# Patient Record
Sex: Male | Born: 1960 | Race: White | Hispanic: No | Smoking: Current every day smoker
Health system: Southern US, Community
[De-identification: ages and names within clinical notes are randomized; demographics above are authoritative.]

## PROBLEM LIST (undated history)

## (undated) DIAGNOSIS — I219 Acute myocardial infarction, unspecified: Secondary | ICD-10-CM

## (undated) DIAGNOSIS — K922 Gastrointestinal hemorrhage, unspecified: Secondary | ICD-10-CM

## (undated) DIAGNOSIS — M272 Inflammatory conditions of jaws: Secondary | ICD-10-CM

## (undated) DIAGNOSIS — I2541 Coronary artery aneurysm: Secondary | ICD-10-CM

## (undated) DIAGNOSIS — I2510 Atherosclerotic heart disease of native coronary artery without angina pectoris: Secondary | ICD-10-CM

## (undated) DIAGNOSIS — I2699 Other pulmonary embolism without acute cor pulmonale: Secondary | ICD-10-CM

## (undated) DIAGNOSIS — I82409 Acute embolism and thrombosis of unspecified deep veins of unspecified lower extremity: Secondary | ICD-10-CM

## (undated) DIAGNOSIS — R561 Post traumatic seizures: Secondary | ICD-10-CM

## (undated) DIAGNOSIS — Z951 Presence of aortocoronary bypass graft: Secondary | ICD-10-CM

## (undated) DIAGNOSIS — I10 Essential (primary) hypertension: Secondary | ICD-10-CM

## (undated) HISTORY — PX: IVC FILTER INSERTION: CATH118245

## (undated) HISTORY — PX: PERCUTANEOUS CORONARY STENT INTERVENTION (PCI-S): SHX6016

---

## 2004-10-21 DIAGNOSIS — R561 Post traumatic seizures: Secondary | ICD-10-CM

## 2004-10-21 HISTORY — DX: Post traumatic seizures: R56.1

## 2012-10-21 DIAGNOSIS — Z951 Presence of aortocoronary bypass graft: Secondary | ICD-10-CM

## 2012-10-21 HISTORY — PX: CORONARY ARTERY BYPASS GRAFT: SHX141

## 2012-10-21 HISTORY — DX: Presence of aortocoronary bypass graft: Z95.1

## 2016-10-21 DIAGNOSIS — M272 Inflammatory conditions of jaws: Secondary | ICD-10-CM

## 2016-10-21 HISTORY — DX: Inflammatory conditions of jaws: M27.2

## 2017-10-29 HISTORY — PX: ANEURYSM COILING: SHX5349

## 2017-11-04 ENCOUNTER — Other Ambulatory Visit: Payer: Self-pay

## 2017-11-04 ENCOUNTER — Emergency Department (HOSPITAL_COMMUNITY): Payer: Self-pay

## 2017-11-04 ENCOUNTER — Inpatient Hospital Stay (HOSPITAL_COMMUNITY)
Admission: EM | Admit: 2017-11-04 | Discharge: 2017-11-11 | DRG: 287 | Disposition: A | Discharge status: Home / Self Care | Payer: Self-pay | Attending: Nephrology | Admitting: Nephrology

## 2017-11-04 ENCOUNTER — Encounter (HOSPITAL_COMMUNITY): Payer: Self-pay

## 2017-11-04 DIAGNOSIS — Z91041 Radiographic dye allergy status: Secondary | ICD-10-CM

## 2017-11-04 DIAGNOSIS — D696 Thrombocytopenia, unspecified: Secondary | ICD-10-CM | POA: Diagnosis present

## 2017-11-04 DIAGNOSIS — R509 Fever, unspecified: Secondary | ICD-10-CM | POA: Diagnosis present

## 2017-11-04 DIAGNOSIS — M279 Disease of jaws, unspecified: Secondary | ICD-10-CM

## 2017-11-04 DIAGNOSIS — I252 Old myocardial infarction: Secondary | ICD-10-CM

## 2017-11-04 DIAGNOSIS — G40909 Epilepsy, unspecified, not intractable, without status epilepticus: Secondary | ICD-10-CM

## 2017-11-04 DIAGNOSIS — Z886 Allergy status to analgesic agent status: Secondary | ICD-10-CM

## 2017-11-04 DIAGNOSIS — I25729 Atherosclerosis of autologous artery coronary artery bypass graft(s) with unspecified angina pectoris: Secondary | ICD-10-CM

## 2017-11-04 DIAGNOSIS — Z86711 Personal history of pulmonary embolism: Secondary | ICD-10-CM

## 2017-11-04 DIAGNOSIS — Z79899 Other long term (current) drug therapy: Secondary | ICD-10-CM

## 2017-11-04 DIAGNOSIS — Z792 Long term (current) use of antibiotics: Secondary | ICD-10-CM

## 2017-11-04 DIAGNOSIS — M272 Inflammatory conditions of jaws: Secondary | ICD-10-CM | POA: Diagnosis present

## 2017-11-04 DIAGNOSIS — Z7901 Long term (current) use of anticoagulants: Secondary | ICD-10-CM

## 2017-11-04 DIAGNOSIS — R042 Hemoptysis: Secondary | ICD-10-CM | POA: Diagnosis not present

## 2017-11-04 DIAGNOSIS — Z9119 Patient's noncompliance with other medical treatment and regimen: Secondary | ICD-10-CM

## 2017-11-04 DIAGNOSIS — Z951 Presence of aortocoronary bypass graft: Secondary | ICD-10-CM

## 2017-11-04 DIAGNOSIS — Z7902 Long term (current) use of antithrombotics/antiplatelets: Secondary | ICD-10-CM

## 2017-11-04 DIAGNOSIS — I2510 Atherosclerotic heart disease of native coronary artery without angina pectoris: Secondary | ICD-10-CM | POA: Diagnosis present

## 2017-11-04 DIAGNOSIS — F17210 Nicotine dependence, cigarettes, uncomplicated: Secondary | ICD-10-CM | POA: Diagnosis present

## 2017-11-04 DIAGNOSIS — I723 Aneurysm of iliac artery: Secondary | ICD-10-CM | POA: Diagnosis present

## 2017-11-04 DIAGNOSIS — Z955 Presence of coronary angioplasty implant and graft: Secondary | ICD-10-CM

## 2017-11-04 DIAGNOSIS — I10 Essential (primary) hypertension: Secondary | ICD-10-CM | POA: Diagnosis present

## 2017-11-04 DIAGNOSIS — F418 Other specified anxiety disorders: Secondary | ICD-10-CM | POA: Diagnosis present

## 2017-11-04 DIAGNOSIS — R079 Chest pain, unspecified: Principal | ICD-10-CM | POA: Diagnosis present

## 2017-11-04 DIAGNOSIS — Z86718 Personal history of other venous thrombosis and embolism: Secondary | ICD-10-CM

## 2017-11-04 DIAGNOSIS — G40509 Epileptic seizures related to external causes, not intractable, without status epilepticus: Secondary | ICD-10-CM | POA: Diagnosis present

## 2017-11-04 HISTORY — DX: Atherosclerotic heart disease of native coronary artery without angina pectoris: I25.10

## 2017-11-04 HISTORY — DX: Essential (primary) hypertension: I10

## 2017-11-04 HISTORY — DX: Acute myocardial infarction, unspecified: I21.9

## 2017-11-04 HISTORY — DX: Post traumatic seizures: R56.1

## 2017-11-04 HISTORY — DX: Inflammatory conditions of jaws: M27.2

## 2017-11-04 HISTORY — DX: Other pulmonary embolism without acute cor pulmonale: I26.99

## 2017-11-04 HISTORY — DX: Gastrointestinal hemorrhage, unspecified: K92.2

## 2017-11-04 HISTORY — DX: Presence of aortocoronary bypass graft: Z95.1

## 2017-11-04 HISTORY — DX: Acute embolism and thrombosis of unspecified deep veins of unspecified lower extremity: I82.409

## 2017-11-04 HISTORY — DX: Coronary artery aneurysm: I25.41

## 2017-11-04 LAB — BASIC METABOLIC PANEL
ANION GAP: 6 (ref 5–15)
BUN: 18 mg/dL (ref 6–20)
CHLORIDE: 112 mmol/L — AB (ref 101–111)
CO2: 20 mmol/L — ABNORMAL LOW (ref 22–32)
Calcium: 8.6 mg/dL — ABNORMAL LOW (ref 8.9–10.3)
Creatinine, Ser: 0.85 mg/dL (ref 0.61–1.24)
GFR calc non Af Amer: >60 mL/min (ref >60)
Glucose, Bld: 86 mg/dL (ref 65–99)
POTASSIUM: 3.9 mmol/L (ref 3.5–5.1)
SODIUM: 138 mmol/L (ref 135–145)

## 2017-11-04 LAB — CBC
HEMATOCRIT: 31.8 % — AB (ref 39.0–52.0)
Hemoglobin: 10.1 g/dL — ABNORMAL LOW (ref 13.0–17.0)
MCH: 25.3 pg — ABNORMAL LOW (ref 26.0–34.0)
MCHC: 31.8 g/dL (ref 30.0–36.0)
MCV: 79.5 fL (ref 78.0–100.0)
Platelets: 154 10*3/uL (ref 150–400)
RBC: 4 MIL/uL — AB (ref 4.22–5.81)
RDW: 22.9 % — AB (ref 11.5–15.5)
WBC: 4.9 10*3/uL (ref 4.0–10.5)

## 2017-11-04 LAB — I-STAT TROPONIN, ED: Troponin i, poc: 0 ng/mL (ref 0.00–0.08)

## 2017-11-04 MED ORDER — NITROGLYCERIN 0.4 MG SL SUBL: 0.4000 mg | SUBLINGUAL_TABLET | SUBLINGUAL | Status: DC | PRN

## 2017-11-04 MED ORDER — METOPROLOL SUCCINATE ER 25 MG PO TB24
25.0000 mg | ORAL_TABLET | Freq: Every day | ORAL | Status: DC
  Administered 2017-11-05 – 2017-11-10 (×5): 25 mg via ORAL
  Filled 2017-11-04 (×7): qty 1

## 2017-11-04 MED ORDER — LEVETIRACETAM 500 MG PO TABS
1000.0000 mg | ORAL_TABLET | Freq: Two times a day (BID) | ORAL | Status: DC
  Administered 2017-11-05 – 2017-11-10 (×12): 1000 mg via ORAL
  Filled 2017-11-04 (×14): qty 2

## 2017-11-04 MED ORDER — ENOXAPARIN SODIUM 80 MG/0.8ML ~~LOC~~ SOLN
80.0000 mg | Freq: Two times a day (BID) | SUBCUTANEOUS | Status: AC
  Administered 2017-11-05 (×2): 80 mg via SUBCUTANEOUS
  Filled 2017-11-04 (×3): qty 0.8

## 2017-11-04 MED ORDER — SODIUM CHLORIDE 0.9 % IV SOLN
1.5000 g | Freq: Four times a day (QID) | INTRAVENOUS | Status: DC
  Administered 2017-11-05 – 2017-11-11 (×22): 1.5 g via INTRAVENOUS
  Filled 2017-11-04 (×29): qty 1.5

## 2017-11-04 MED ORDER — ONDANSETRON HCL 4 MG/2ML IJ SOLN
4.0000 mg | Freq: Four times a day (QID) | INTRAMUSCULAR | Status: DC | PRN
  Administered 2017-11-06 – 2017-11-08 (×2): 4 mg via INTRAVENOUS
  Filled 2017-11-04 (×3): qty 2

## 2017-11-04 MED ORDER — ONDANSETRON HCL 4 MG/2ML IJ SOLN
4.0000 mg | Freq: Once | INTRAMUSCULAR | Status: AC
  Administered 2017-11-04: 4 mg via INTRAVENOUS
  Filled 2017-11-04: qty 2

## 2017-11-04 MED ORDER — DIPHENHYDRAMINE HCL 50 MG/ML IJ SOLN
50.0000 mg | Freq: Four times a day (QID) | INTRAMUSCULAR | Status: DC | PRN
  Administered 2017-11-05 – 2017-11-10 (×18): 50 mg via INTRAVENOUS
  Filled 2017-11-04 (×18): qty 1

## 2017-11-04 MED ORDER — TEMAZEPAM 15 MG PO CAPS
30.0000 mg | ORAL_CAPSULE | Freq: Every evening | ORAL | Status: DC | PRN
  Administered 2017-11-06 – 2017-11-07 (×2): 30 mg via ORAL
  Filled 2017-11-04 (×2): qty 2

## 2017-11-04 MED ORDER — LAMOTRIGINE 100 MG PO TABS
100.0000 mg | ORAL_TABLET | Freq: Two times a day (BID) | ORAL | Status: DC
  Administered 2017-11-05 – 2017-11-10 (×12): 100 mg via ORAL
  Filled 2017-11-04 (×14): qty 1

## 2017-11-04 MED ORDER — MORPHINE SULFATE (PF) 4 MG/ML IV SOLN
4.0000 mg | Freq: Once | INTRAVENOUS | Status: AC
  Administered 2017-11-04: 4 mg via INTRAVENOUS
  Filled 2017-11-04: qty 1

## 2017-11-04 MED ORDER — MORPHINE SULFATE (PF) 2 MG/ML IV SOLN
2.0000 mg | INTRAVENOUS | Status: DC | PRN
  Administered 2017-11-05 (×6): 2 mg via INTRAVENOUS
  Filled 2017-11-04 (×6): qty 1

## 2017-11-04 MED ORDER — ATORVASTATIN CALCIUM 20 MG PO TABS
20.0000 mg | ORAL_TABLET | Freq: Every day | ORAL | Status: DC
  Administered 2017-11-05 – 2017-11-10 (×5): 20 mg via ORAL
  Filled 2017-11-04 (×7): qty 1

## 2017-11-04 MED ORDER — TICAGRELOR 90 MG PO TABS
90.0000 mg | ORAL_TABLET | Freq: Every day | ORAL | Status: DC
  Administered 2017-11-05: 90 mg via ORAL
  Filled 2017-11-04: qty 1

## 2017-11-04 MED ORDER — LOSARTAN POTASSIUM 50 MG PO TABS
100.0000 mg | ORAL_TABLET | Freq: Every day | ORAL | Status: DC
  Administered 2017-11-05 – 2017-11-10 (×5): 100 mg via ORAL
  Filled 2017-11-04 (×7): qty 2

## 2017-11-04 MED ORDER — SERTRALINE HCL 100 MG PO TABS
100.0000 mg | ORAL_TABLET | Freq: Every day | ORAL | Status: DC
  Administered 2017-11-05 – 2017-11-10 (×5): 100 mg via ORAL
  Filled 2017-11-04: qty 2
  Filled 2017-11-04 (×2): qty 1
  Filled 2017-11-04 (×2): qty 2
  Filled 2017-11-04 (×2): qty 1

## 2017-11-04 MED ORDER — ACETAMINOPHEN 325 MG PO TABS
650.0000 mg | ORAL_TABLET | ORAL | Status: DC | PRN
  Administered 2017-11-08 (×3): 650 mg via ORAL
  Filled 2017-11-04 (×3): qty 2

## 2017-11-04 MED ORDER — GI COCKTAIL ~~LOC~~: 30.0000 mL | Freq: Four times a day (QID) | ORAL | Status: DC | PRN

## 2017-11-04 NOTE — ED Triage Notes (Signed)
Pt complains of left sided chest pressure that started 5 hours ago, it radiates to his back, left arm and his jaw Pt has extensive cardiac hx with the latest being an aneurysm being removed last week

## 2017-11-04 NOTE — ED Notes (Signed)
Pt continues to take himself off of cardiac monitor and walk in and out of his room.  Has been told several times the importance of the monitor. Made EDP aware.

## 2017-11-04 NOTE — ED Notes (Signed)
Pt is aware a urine sample is needed. 

## 2017-11-04 NOTE — ED Notes (Signed)
Patient refusing cardiac monitoring. Particia NearingHaviland, MD aware.

## 2017-11-04 NOTE — ED Provider Notes (Signed)
[facility]-EMERGENCY DEPT Provider Note   CSN: [id number] Arrival date & time: 11/04/17  1952     History   Chief Complaint Chief Complaint  Patient presents with  . Chest Pain    HPI Marvin Young is a 57 y.o. male.  Pt presents to the ED today with cp.  The pt has a hx of CABG and multiple stents.  Last stent was in 2017.  The pt just had a iliac artery aneurysm repair last week at the [place].  The pt has just moved here.  The pt said his cp started 5 hours ago which radiated to his back and to his left arm and jaw.  The pt took 4 nitro sl which were less than a month old.  Sx relieved for a short while, but then started again.  The pt has a PICC line that has been placed as he's also on unasyn for a jaw infection.      Past Medical History:  Diagnosis Date  . Aneurysm (arteriovenous) of coronary vessels   . Coronary artery disease   . MI (myocardial infarction) (HCC)     There are no active problems to display for this patient.   History reviewed. No pertinent surgical history.     Home Medications    Prior to Admission medications   Medication Sig Start Date End Date Taking? Authorizing Provider  ampicillin-sulbactam 1.5 g in sodium chloride 0.9 % 50 mL Inject 1.5 g into the vein every 6 (six) hours.   Yes [provider]  atorvastatin (LIPITOR) 20 MG tablet Take 20 mg by mouth daily.   Yes [provider]  diphenhydrAMINE (BENADRYL) 50 MG/ML injection Inject 50 mg into the vein every 6 (six) hours as needed for itching.    Yes [provider]  enoxaparin (LOVENOX) 80 MG/0.8ML injection Inject 80 mg into the skin every 12 (twelve) hours.   Yes [provider]  lamoTRIgine (LAMICTAL) 100 MG tablet Take 100 mg by mouth 2 (two) times daily.   Yes [provider]  levETIRAcetam (KEPPRA) 1000 MG tablet Take 1,000 mg by mouth 2 (two) times daily.   Yes [provider]  losartan  (COZAAR) 100 MG tablet Take 100 mg by mouth daily.   Yes [provider]  metoprolol succinate (TOPROL-XL) 25 MG 24 hr tablet Take 25 mg by mouth daily.   Yes [provider]  sertraline (ZOLOFT) 100 MG tablet Take 100 mg by mouth daily.   Yes [provider]  temazepam (RESTORIL) 30 MG capsule Take 30 mg by mouth at bedtime as needed for sleep.   Yes [provider]  ticagrelor (BRILINTA) 90 MG TABS tablet Take 90 mg by mouth daily.   Yes [provider]    Family History History reviewed. No pertinent family history.  Social History Social History   Tobacco Use  . Smoking status: Never Smoker  . Smokeless tobacco: Never Used  Substance Use Topics  . Alcohol use: No    Frequency: Never  . Drug use: No     Allergies   Asa [aspirin]; Contrast media [iodinated diagnostic agents]; and Sulfur   Review of Systems Review of Systems  Cardiovascular: Positive for chest pain.  All other systems reviewed and are negative.    Physical Exam Updated Vital Signs BP (!) 180/95   Pulse 73   Temp 98.7 F (37.1 C) (Oral)   Resp (!) 24   Ht 5\' 8"  (  1.727 m)   Wt 83 kg (183 lb)   SpO2 97%   BMI 27.83 kg/m   Physical Exam  Constitutional: He is oriented to person, place, and time. He appears well-developed and well-nourished.  HENT:  Head: Normocephalic and atraumatic.  Eyes: EOM are normal. Pupils are equal, round, and reactive to light.  Neck: Normal range of motion. Neck supple.  Cardiovascular: Normal rate and regular rhythm.  Pulmonary/Chest: Effort normal and breath sounds normal.  Abdominal: Soft. Bowel sounds are normal.  Musculoskeletal: Normal range of motion.       Right lower leg: Normal.       Left lower leg: Normal.  picc line left arm  Neurological: He is alert and oriented to person, place, and time.  Skin: Skin is warm and dry. Capillary refill takes less than 2 seconds.  Psychiatric: He has a normal mood and  affect. His behavior is normal.  Nursing note and vitals reviewed.    ED Treatments / Results  Labs (all labs ordered are listed, but only abnormal results are displayed) Labs Reviewed  BASIC METABOLIC PANEL - Abnormal; Notable for the following components:      Result Value   Chloride 112 (*)    CO2 20 (*)    Calcium 8.6 (*)    All other components within normal limits  CBC - Abnormal; Notable for the following components:   RBC 4.00 (*)    Hemoglobin 10.1 (*)    HCT 31.8 (*)    MCH 25.3 (*)    RDW 22.9 (*)    All other components within normal limits  I-STAT TROPONIN, ED    EKG  EKG Interpretation None     EKG not crossing over in MUSE:  HR 87.  NSR.  No st or t wave changes.  Radiology Dg Chest 2 View  Result Date: 11/04/2017 CLINICAL DATA:  Chest pain EXAM: CHEST  2 VIEW COMPARISON:  None. FINDINGS: Cardiomegaly. Median sternotomy wires appear intact and appropriately aligned. Lungs are clear. No pleural effusion or pneumothorax seen. No acute or suspicious osseous finding. Left-sided PICC line appears well positioned with tip at the level of the SVC/cavoatrial junction. IMPRESSION: No acute findings. Cardiomegaly. No evidence of pneumonia or pulmonary edema. Electronically Signed   By: Bary RichardStan  Maynard M.D.   On: 11/04/2017 21:26    Procedures Procedures (including critical care time)  Medications Ordered in ED Medications  ondansetron (ZOFRAN) injection 4 mg (4 mg Intravenous Given 11/04/17 2055)  morphine 4 MG/ML injection 4 mg (4 mg Intravenous Given 11/04/17 2056)  morphine 4 MG/ML injection 4 mg (4 mg Intravenous Given 11/04/17 2222)     Initial Impression / Assessment and Plan / ED Course  I have reviewed the triage vital signs and the nursing notes.  Pertinent labs & imaging results that were available during my care of the patient were reviewed by me and considered in my medical decision making (see chart for details).  Pt did not get asa as he is  anaphylactic to it.   Heart score 4.  Sx typical of prior cp resulting in stents.  We have requested records from [facility], but have not yet received them.  Pt d/w Dr. Katrinka BlazingSmith ([facility]) who will admit.  Final Clinical Impressions(s) / ED Diagnoses   Final diagnoses:  Chest pain, unspecified type    ED Discharge Orders    None       Jacalyn LefevreHaviland, Macguire Holsinger, MD 11/04/17 2306

## 2017-11-04 NOTE — ED Notes (Signed)
Pt reports that he is allergic to iodine and request prednisone and benadryl prior to going for test MD made aware,

## 2017-11-04 NOTE — H&P (Addendum)
History and Physical    Nazir Hacker MVH:[id number] DOB: 1961/02/22 DOA: 11/04/2017  Referring MD/NP/PA: Dr. Particia Nearing PCP: System, Pcp Not In  Patient coming from: home  Chief Complaint: Chest pain  I have personally briefly reviewed patient's old medical records in [facility]   HPI: Marvin Young is a 57 y.o. male with medical history significant of CAD s/p PCI and CABG, MI, DVT/PE, on chronic anticoagulation, and seizure disorder following TBI; who presents with complaints of left-sided chest pain since around 3 -4 PM this afternoon.  Pain is described as sharp and radiates to his back, left arm, and jaw.  Reported associated symptoms of nausea, vomiting, diaphoresis, shortness of breath, and lightheadedness.  He denies having any loss of consciousness.  Tried taking 4 nitroglycerin tablets without any relief of symptoms.  Rated pain as a 8 out of 10 at its worst point.    He recently moved from [facility] where he reports receiving most of his care at the [facility] in [place].  He is currently on 6 weeks of Unasyn for treatment of a jaw infection after having several of his teeth removed.  Lastly notes being status post iliac artery aneurysm repair sometime last week.  He has been taking subcutaneous Lovenox as advised and the plan was to bridge him back to Coumadin next week.  He admits to continuing to smoke but reports being down to only 3 cigarettes per day.  ED Course: Admission into the emergency department patient was seen to be afebrile with blood pressure 126/96-160 7/87, and all other vital signs relatively within normal limits.  Labs revealed WBC 4.9, hemoglobin 10.1, and troponin 0.  Records were requested from the [facility] hospital but not obtained yet.  Patient reported having anaphylaxis allergy to aspirin and therefore was not given this.  Review of Systems  Constitutional: Positive for diaphoresis. Negative for chills and fever.  HENT: Negative for ear  discharge and nosebleeds.   Eyes: Negative for photophobia and pain.  Respiratory: Positive for shortness of breath. Negative for cough.   Cardiovascular: Positive for chest pain. Negative for leg swelling.  Gastrointestinal: Positive for abdominal pain, nausea and vomiting. Negative for blood in stool and diarrhea.  Genitourinary: Negative for dysuria and frequency.  Musculoskeletal: Positive for back pain and neck pain. Negative for falls.  Skin: Negative for itching and rash.  Neurological: Positive for dizziness. Negative for sensory change, focal weakness, seizures and loss of consciousness.  Psychiatric/Behavioral: Positive for substance abuse. Negative for suicidal ideas.    Past Medical History:  Diagnosis Date  . Aneurysm (arteriovenous) of coronary vessels   . Coronary artery disease   . MI (myocardial infarction) (HCC)     History reviewed. No pertinent surgical history.   reports that  has never smoked. he has never used smokeless tobacco. He reports that he does not drink alcohol or use drugs.  Allergies  Allergen Reactions  . Asa [Aspirin] Anaphylaxis  . Contrast Media [Iodinated Diagnostic Agents] Hives  . Sulfur Hives    History reviewed. No pertinent family history diabetes mellitus.  Prior to Admission medications   Medication Sig Start Date End Date Taking? Authorizing Provider  ampicillin-sulbactam 1.5 g in sodium chloride 0.9 % 50 mL Inject 1.5 g into the vein every 6 (six) hours.   Yes [provider]  atorvastatin (LIPITOR) 20 MG tablet Take 20 mg by mouth daily.   Yes [provider]  diphenhydrAMINE (BENADRYL) 50 MG/ML injection Inject 50 mg  into the vein every 6 (six) hours as needed for itching.    Yes [provider]  enoxaparin (LOVENOX) 80 MG/0.8ML injection Inject 80 mg into the skin every 12 (twelve) hours.   Yes [provider]  lamoTRIgine (LAMICTAL) 100 MG tablet Take 100 mg by mouth 2 (two) times daily.    Yes [provider]  levETIRAcetam (KEPPRA) 1000 MG tablet Take 1,000 mg by mouth 2 (two) times daily.   Yes [provider]  losartan (COZAAR) 100 MG tablet Take 100 mg by mouth daily.   Yes [provider]  metoprolol succinate (TOPROL-XL) 25 MG 24 hr tablet Take 25 mg by mouth daily.   Yes [provider]  sertraline (ZOLOFT) 100 MG tablet Take 100 mg by mouth daily.   Yes [provider]  temazepam (RESTORIL) 30 MG capsule Take 30 mg by mouth at bedtime as needed for sleep.   Yes [provider]  ticagrelor (BRILINTA) 90 MG TABS tablet Take 90 mg by mouth daily.   Yes [provider]    Physical Exam:  Constitutional: Middle-aged male NAD, calm, comfortable Vitals:   11/04/17 2041 11/04/17 2054 11/04/17 2224 11/04/17 2224  BP:   (!) 167/87   Pulse: 80 81 72   Resp: 13 15 17    Temp:   98.7 F (37.1 C)   TempSrc:   Oral   SpO2: 100% 98% 97%   Weight:    83 kg (183 lb)  Height:    5\' 8"  (1.727 m)   Eyes: PERRL, lids and conjunctivae normal ENMT: Mucous membranes are moist. Posterior pharynx clear of any exudate or lesions. Poor dentition with swelling of the left upper mandible. Neck: normal, supple, no masses, no thyromegaly Respiratory: clear to auscultation bilaterally, no wheezing, no crackles. Normal respiratory effort. No accessory muscle use.  Cardiovascular: Regular rate and rhythm, no murmurs / rubs / gallops. No extremity edema. 2+ pedal pulses. No carotid bruits.  Left arm PICC present Abdomen: no tenderness, no masses palpated. No hepatosplenomegaly. Bowel sounds positive.  Musculoskeletal: no clubbing / cyanosis. No joint deformity upper and lower extremities. Good ROM, no contractures. Normal muscle tone.  Skin: Healing surgical wounds without significant signs of erythema. Neurologic: CN 2-12 grossly intact. Sensation intact, DTR normal. Strength 5/5 in all 4.  Psychiatric: Normal judgment and insight.  Alert and oriented x 3. Normal mood.     Labs on Admission: I have personally reviewed following labs and imaging studies  CBC: Recent Labs  Lab 11/04/17 2037  WBC 4.9  HGB 10.1*  HCT 31.8*  MCV 79.5  PLT 154   Basic Metabolic Panel: Recent Labs  Lab 11/04/17 2037  NA 138  K 3.9  CL 112*  CO2 20*  GLUCOSE 86  BUN 18  CREATININE 0.85  CALCIUM 8.6*   GFR: Estimated Creatinine Clearance: 104.3 mL/min (by C-G formula based on SCr of 0.85 mg/dL). Liver Function Tests: No results for input(s): AST, ALT, ALKPHOS, BILITOT, PROT, ALBUMIN in the last 168 hours. No results for input(s): LIPASE, AMYLASE in the last 168 hours. No results for input(s): AMMONIA in the last 168 hours. Coagulation Profile: No results for input(s): INR, PROTIME in the last 168 hours. Cardiac Enzymes: No results for input(s): CKTOTAL, CKMB, CKMBINDEX, TROPONINI in the last 168 hours. BNP (last 3 results) No results for input(s): PROBNP in the last 8760 hours. HbA1C: No results for input(s): HGBA1C in the last 72 hours. CBG: No results for input(s): GLUCAP  in the last 168 hours. Lipid Profile: No results for input(s): CHOL, HDL, LDLCALC, TRIG, CHOLHDL, LDLDIRECT in the last 72 hours. Thyroid Function Tests: No results for input(s): TSH, T4TOTAL, FREET4, T3FREE, THYROIDAB in the last 72 hours. Anemia Panel: No results for input(s): VITAMINB12, FOLATE, FERRITIN, TIBC, IRON, RETICCTPCT in the last 72 hours. Urine analysis: No results found for: COLORURINE, APPEARANCEUR, LABSPEC, PHURINE, GLUCOSEU, HGBUR, BILIRUBINUR, KETONESUR, PROTEINUR, UROBILINOGEN, NITRITE, LEUKOCYTESUR Sepsis Labs: No results found for this or any previous visit (from the past 240 hour(s)).   Radiological Exams on Admission: Dg Chest 2 View  Result Date: 11/04/2017 CLINICAL DATA:  Chest pain EXAM: CHEST  2 VIEW COMPARISON:  None. FINDINGS: Cardiomegaly. Median sternotomy wires appear intact and appropriately aligned. Lungs  are clear. No pleural effusion or pneumothorax seen. No acute or suspicious osseous finding. Left-sided PICC line appears well positioned with tip at the level of the SVC/cavoatrial junction. IMPRESSION: No acute findings. Cardiomegaly. No evidence of pneumonia or pulmonary edema. Electronically Signed   By: Bary Richard M.D.   On: 11/04/2017 21:26    EKG: Independently reviewed.  Normal sinus rhythm without significant ST wave changes at 87 bpm  Assessment/Plan Chest pain: Acute.  Patient reports having pain radiating to his back and jaw.  Initial chest x-ray negative for any - Admit to a telemetry bed - Check CT angiogram gram of the chest/abd/ pelvis stat with contrast with contrast allergy protocol - Trend cardiac troponins every 3 hours x3 - Check UDS - Check lipid panel in a.m.  - Check echocardiogram in a.m. - Message sent for cardiology to eval - Follow-up records  CAD: Patient reports history of CABG and multiple stents last being placed sometime in 2017. - Continue Brilinta  Odontogenic infection: Patient reports needing to complete 6-week of Unasyn. - Continue Unasyn   Iliac artery aneurysm: Status post repair last week.  Seizure disorder following TBI - Continue Lamictal and Keppra  Tobacco abuse: Patient reports utilizing only 3 cigarettes/day, but had reportedly left hospital bed multiple times to go out and smoke.  Patient declined need a nicotine patch. - Counseled on the need of cessation of tobacco DVT prophylaxis: Lovenox Code Status: Full  Family Communication: No family present at bedside Disposition Plan: TBD  Consults called: none  Admission status: Observation  Clydie Braun MD [facility] Pager [phone]   If 7PM-7AM, please contact night-coverage [email or web address] Password [facility]  11/04/2017, 10:52 PM

## 2017-11-05 ENCOUNTER — Observation Stay (HOSPITAL_COMMUNITY): Payer: Self-pay

## 2017-11-05 ENCOUNTER — Encounter (HOSPITAL_COMMUNITY): Payer: Self-pay

## 2017-11-05 DIAGNOSIS — M272 Inflammatory conditions of jaws: Secondary | ICD-10-CM | POA: Diagnosis present

## 2017-11-05 DIAGNOSIS — R079 Chest pain, unspecified: Principal | ICD-10-CM

## 2017-11-05 DIAGNOSIS — G40909 Epilepsy, unspecified, not intractable, without status epilepticus: Secondary | ICD-10-CM

## 2017-11-05 DIAGNOSIS — I2510 Atherosclerotic heart disease of native coronary artery without angina pectoris: Secondary | ICD-10-CM | POA: Diagnosis present

## 2017-11-05 DIAGNOSIS — I723 Aneurysm of iliac artery: Secondary | ICD-10-CM | POA: Diagnosis present

## 2017-11-05 LAB — RAPID URINE DRUG SCREEN, HOSP PERFORMED
AMPHETAMINES: NOT DETECTED
BENZODIAZEPINES: POSITIVE — AB
Barbiturates: NOT DETECTED
COCAINE: NOT DETECTED
OPIATES: POSITIVE — AB
TETRAHYDROCANNABINOL: NOT DETECTED

## 2017-11-05 LAB — LIPID PANEL
CHOLESTEROL: 139 mg/dL (ref 0–200)
HDL: 53 mg/dL (ref >40)
LDL CALC: 54 mg/dL (ref 0–99)
TRIGLYCERIDES: 158 mg/dL — AB (ref <150)
Total CHOL/HDL Ratio: 2.6 RATIO
VLDL: 32 mg/dL (ref 0–40)

## 2017-11-05 LAB — BASIC METABOLIC PANEL
Anion gap: 5 (ref 5–15)
BUN: 21 mg/dL — AB (ref 6–20)
CALCIUM: 8.7 mg/dL — AB (ref 8.9–10.3)
CO2: 22 mmol/L (ref 22–32)
Chloride: 110 mmol/L (ref 101–111)
Creatinine, Ser: 0.93 mg/dL (ref 0.61–1.24)
GFR calc Af Amer: >60 mL/min (ref >60)
GLUCOSE: 120 mg/dL — AB (ref 65–99)
Potassium: 4.3 mmol/L (ref 3.5–5.1)
Sodium: 137 mmol/L (ref 135–145)

## 2017-11-05 LAB — TROPONIN I
Troponin I: <0.03 ng/mL (ref <0.03)
Troponin I: <0.03 ng/mL (ref <0.03)

## 2017-11-05 LAB — CBC WITH DIFFERENTIAL/PLATELET
BASOS PCT: 0 %
Basophils Absolute: 0 10*3/uL (ref 0.0–0.1)
EOS ABS: 0.1 10*3/uL (ref 0.0–0.7)
EOS PCT: 2 %
HEMATOCRIT: 31.3 % — AB (ref 39.0–52.0)
Hemoglobin: 9.7 g/dL — ABNORMAL LOW (ref 13.0–17.0)
LYMPHS ABS: 0.6 10*3/uL — AB (ref 0.7–4.0)
Lymphocytes Relative: 11 %
MCH: 24.7 pg — AB (ref 26.0–34.0)
MCHC: 31 g/dL (ref 30.0–36.0)
MCV: 79.6 fL (ref 78.0–100.0)
MONO ABS: 0.3 10*3/uL (ref 0.1–1.0)
Monocytes Relative: 5 %
NEUTROS ABS: 4.4 10*3/uL (ref 1.7–7.7)
Neutrophils Relative %: 82 %
PLATELETS: 139 10*3/uL — AB (ref 150–400)
RBC: 3.93 MIL/uL — ABNORMAL LOW (ref 4.22–5.81)
RDW: 22.9 % — AB (ref 11.5–15.5)
WBC: 5.4 10*3/uL (ref 4.0–10.5)

## 2017-11-05 MED ORDER — DIPHENHYDRAMINE HCL 50 MG/ML IJ SOLN
50.0000 mg | Freq: Once | INTRAMUSCULAR | Status: AC
  Administered 2017-11-05: 50 mg via INTRAVENOUS
  Filled 2017-11-05: qty 1

## 2017-11-05 MED ORDER — DIPHENHYDRAMINE HCL 25 MG PO CAPS: 50.0000 mg | ORAL_CAPSULE | Freq: Once | ORAL | Status: AC

## 2017-11-05 MED ORDER — DIPHENHYDRAMINE HCL 50 MG/ML IJ SOLN: 50.0000 mg | Freq: Once | INTRAMUSCULAR | Status: AC

## 2017-11-05 MED ORDER — SODIUM CHLORIDE 0.9% FLUSH
3.0000 mL | Freq: Two times a day (BID) | INTRAVENOUS | Status: DC
  Administered 2017-11-05 – 2017-11-06 (×2): 3 mL via INTRAVENOUS

## 2017-11-05 MED ORDER — IOPAMIDOL (ISOVUE-370) INJECTION 76%
INTRAVENOUS | Status: AC
  Administered 2017-11-05: 100 mL via INTRAVENOUS
  Filled 2017-11-05: qty 100

## 2017-11-05 MED ORDER — DIPHENHYDRAMINE HCL 50 MG PO CAPS
50.0000 mg | ORAL_CAPSULE | Freq: Once | ORAL | Status: AC
  Administered 2017-11-06: 50 mg via ORAL
  Filled 2017-11-05: qty 1

## 2017-11-05 MED ORDER — SODIUM CHLORIDE 0.9 % IV SOLN: 250.0000 mL | INTRAVENOUS | Status: DC | PRN

## 2017-11-05 MED ORDER — TICAGRELOR 90 MG PO TABS
90.0000 mg | ORAL_TABLET | Freq: Two times a day (BID) | ORAL | Status: DC
  Administered 2017-11-05 – 2017-11-10 (×10): 90 mg via ORAL
  Filled 2017-11-05 (×12): qty 1

## 2017-11-05 MED ORDER — SODIUM CHLORIDE 0.9 % WEIGHT BASED INFUSION: 3.0000 mL/kg/h | INTRAVENOUS | Status: DC

## 2017-11-05 MED ORDER — MORPHINE SULFATE (PF) 4 MG/ML IV SOLN
2.0000 mg | INTRAVENOUS | Status: DC | PRN
  Administered 2017-11-05 – 2017-11-08 (×21): 2 mg via INTRAVENOUS
  Filled 2017-11-05 (×21): qty 1

## 2017-11-05 MED ORDER — IOPAMIDOL (ISOVUE-370) INJECTION 76%
100.0000 mL | Freq: Once | INTRAVENOUS | Status: AC | PRN
  Administered 2017-11-05: 100 mL via INTRAVENOUS

## 2017-11-05 MED ORDER — HYDROCORTISONE NA SUCCINATE PF 100 MG IJ SOLR
200.0000 mg | Freq: Once | INTRAMUSCULAR | Status: AC
  Administered 2017-11-05: 200 mg via INTRAVENOUS
  Filled 2017-11-05: qty 4

## 2017-11-05 MED ORDER — SODIUM CHLORIDE 0.9% FLUSH: 3.0000 mL | INTRAVENOUS | Status: DC | PRN

## 2017-11-05 MED ORDER — PREDNISONE 50 MG PO TABS
50.0000 mg | ORAL_TABLET | Freq: Four times a day (QID) | ORAL | Status: AC
  Administered 2017-11-06 (×3): 50 mg via ORAL
  Filled 2017-11-05 (×3): qty 1

## 2017-11-05 MED ORDER — SODIUM CHLORIDE 0.9% FLUSH
10.0000 mL | INTRAVENOUS | Status: DC | PRN
  Administered 2017-11-05 – 2017-11-07 (×4): 10 mL
  Administered 2017-11-07 – 2017-11-10 (×2): 20 mL
  Administered 2017-11-11: 10 mL
  Filled 2017-11-05 (×6): qty 40

## 2017-11-05 MED ORDER — SODIUM CHLORIDE 0.9 % WEIGHT BASED INFUSION: 1.0000 mL/kg/h | INTRAVENOUS | Status: DC

## 2017-11-05 NOTE — Progress Notes (Signed)
PROGRESS NOTE    Wyline CopasMicheal Oakley  WUJ:[id number] DOB: November 23, 1960 DOA: 11/04/2017 PCP: System, Pcp Not In  Brief Narrative:57 y.o. male with medical history significant of CAD s/p PCI and CABG, MI, DVT/PE, on chronic anticoagulation, and seizure disorder following TBI; who presents with complaints of left-sided chest pain since around 3 -4 PM this afternoon.  Pain is described as sharp and radiates to his back, left arm, and jaw.  Reported associated symptoms of nausea, vomiting, diaphoresis, shortness of breath, and lightheadedness.  He denies having any loss of consciousness.  Tried taking 4 nitroglycerin tablets without any relief of symptoms.  Rated pain as a 8 out of 10 at its worst point.    [place] [facility] [place]  He is currently on 6 weeks of Unasyn for treatment of a jaw infection after having several of his teeth removed.  Lastly notes being status post iliac artery aneurysm repair sometime last week.  He has been taking subcutaneous Lovenox as advised and the plan was to bridge him back to Coumadin next week.  He admits to continuing to smoke but reports being down to only 3 cigarettes per day.  ED Course: Admission into the emergency department patient was seen to be afebrile with blood pressure 126/96-160 7/87, and all other vital signs relatively within normal limits.  Labs revealed WBC 4.9, hemoglobin 10.1, and troponin 0.  [facility] but not obtained yet.  Patient reported having anaphylaxis allergy to aspirin and therefore was not given this.    Assessment & Plan:   Principal Problem:   Chest pain Active Problems:   Odontogenic infection of jaw   Seizure disorder [facility](HCC)   Iliac artery aneurysm (HCC)   CAD (coronary artery disease)  Chest pain/history of CAD CABG 2014 multiple stents in 2017 on Brilinta-troponin negative since admission.  CT  angiogram of the chest and abdomen shows no dissection or PE.  Patient followed by cardiology for possible cardiac cath tomorrow.  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  Osteomyelitis of the jaw-  On Unasyn for a total of 6 weeks.  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   DVT prophylaxis: Lovenox Code Status: Full code Family Communication: None Disposition Plan: Cath to,orrow Consultants: card  Procedures:none Antimicrobials: unasyn  Subjective:still has on and off chest pain  Objective: Vitals:   11/05/17 0900 11/05/17 1023 11/05/17 1030 11/05/17 1100  BP: 119/74 (!) 156/66 (!) 165/79 (!) 132/106  Pulse: 65 65 67 70  Resp: 13  17 20   Temp:      TempSrc:      SpO2: 98%  98% 96%  Weight:      Height:        Intake/Output Summary (Last 24 hours) at 11/05/2017 1511 Last data filed at 11/05/2017 0905 Gross per 24 hour  Intake 100 ml  Output -  Net 100 ml   Filed Weights   11/04/17 2224  Weight: 83 kg (183 lb)    Examination:  General exam: Appears calm and comfortable  Respiratory system: Clear to auscultation. Respiratory effort normal. Cardiovascular system: S1 & S2 heard, RRR. No JVD, murmurs, rubs, gallops or clicks. No pedal edema. Gastrointestinal system: Abdomen is nondistended, soft and nontender.  No organomegaly or masses felt. Normal bowel sounds heard. Central nervous system: Alert and oriented. No focal neurological deficits. Extremities: Symmetric 5 x 5 power. Skin: No rashes, lesions or ulcers Psychiatry: Judgement and insight appear normal. Mood & affect appropriate.     Data Reviewed: I have personally reviewed following labs and imaging studies  CBC: Recent Labs  Lab 11/04/17 2037 11/05/17 0500  WBC 4.9 5.4  NEUTROABS  --  4.4  HGB 10.1* 9.7*    HCT 31.8* 31.3*  MCV 79.5 79.6  PLT 154 139*   Basic Metabolic Panel: Recent Labs  Lab 11/04/17 2037 11/05/17 0500  NA 138 137  K 3.9 4.3  CL 112* 110  CO2 20* 22  GLUCOSE 86 120*  BUN 18 21*  CREATININE 0.85 0.93  CALCIUM 8.6* 8.7*   GFR: Estimated Creatinine Clearance: 95.3 mL/min (by C-G formula based on SCr of 0.93 mg/dL). Liver Function Tests: No results for input(s): AST, ALT, ALKPHOS, BILITOT, PROT, ALBUMIN in the last 168 hours. No results for input(s): LIPASE, AMYLASE in the last 168 hours. No results for input(s): AMMONIA in the last 168 hours. Coagulation Profile: No results for input(s): INR, PROTIME in the last 168 hours. Cardiac Enzymes: Recent Labs  Lab 11/04/17 2342 11/05/17 0359 11/05/17 0500  TROPONINI <0.03 <0.03 <0.03   BNP (last 3 results) No results for input(s): PROBNP in the last 8760 hours. HbA1C: No results for input(s): HGBA1C in the last 72 hours. CBG: No results for input(s): GLUCAP in the last 168 hours. Lipid Profile: Recent Labs    11/05/17 0359  CHOL 139  HDL 53  LDLCALC 54  TRIG 158*  CHOLHDL 2.6   Thyroid Function Tests: No results for input(s): TSH, T4TOTAL, FREET4, T3FREE, THYROIDAB in the last 72 hours. Anemia Panel: No results for input(s): VITAMINB12, FOLATE, FERRITIN, TIBC, IRON, RETICCTPCT in the last 72 hours. Sepsis Labs: No results for input(s): PROCALCITON, LATICACIDVEN in the last 168 hours.  No results found for this or any previous visit (from the past 240 hour(s)).       Radiology Studies: Dg Chest 2 View  Result Date: 11/04/2017 CLINICAL DATA:  Chest pain EXAM: CHEST  2 VIEW COMPARISON:  None. FINDINGS: Cardiomegaly. Median sternotomy wires appear intact and appropriately aligned. Lungs are clear. No pleural effusion or pneumothorax seen. No acute or suspicious osseous finding. Left-sided PICC line appears well positioned with tip at the level of the SVC/cavoatrial junction. IMPRESSION: No acute  findings. Cardiomegaly. No evidence of pneumonia or pulmonary edema. Electronically Signed   By: Bary Richard M.D.   On: 11/04/2017 21:26   Ct Angio Chest/abd/pel For Dissection W And/or W/wo  Result Date: 11/05/2017 CLINICAL DATA:  57 year old male with chest pain. EXAM: CT ANGIOGRAPHY CHEST, ABDOMEN AND PELVIS TECHNIQUE: Multidetector CT imaging through the chest, abdomen and pelvis was performed using the standard protocol during bolus administration of intravenous contrast. Multiplanar reconstructed images and MIPs were obtained and reviewed to evaluate the vascular anatomy. CONTRAST:  100 cc Isovue 370 COMPARISON:  Chest radiograph dated 11/04/2017 FINDINGS: CTA CHEST FINDINGS Cardiovascular: There is mild cardiomegaly. There is coronary vascular calcification primarily involving the LAD and postsurgical changes of CABG. No pericardial effusion. Minimally dilated ascending aorta measures 4.3 cm in diameter. The thoracic aorta is otherwise unremarkable. No aneurysmal dilatation or evidence of dissection. Evaluation of the pulmonary arteries is limited due to respiratory motion artifact and suboptimal opacification of the peripheral branches. No large or central pulmonary artery embolus identified. Left-sided  PICC with tip in the central SVC close to the cavoatrial junction. Mediastinum/Nodes: There is no hilar or mediastinal adenopathy. Esophagus is grossly unremarkable. No mediastinal fluid collection. Lungs/Pleura: Mild diffuse interstitial prominence and hazy and ground-glass airspace densities noted which may represent atelectatic changes or mild interstitial edema. Clinical correlation is recommended. There is no focal consolidation, pleural effusion, or pneumothorax. The central airways are patent. Musculoskeletal: Median sternotomy wires noted. No acute osseous pathology. Review of the MIP images confirms the above findings. CTA ABDOMEN AND PELVIS FINDINGS VASCULAR Aorta: Borderline ectasia  infrarenal aorta measures 2 cm in diameter. There is no aneurysmal dilatation or evidence of dissection. Celiac: Patent without evidence of aneurysm, dissection, vasculitis or significant stenosis. SMA: Patent without evidence of aneurysm, dissection, vasculitis or significant stenosis. Renals: Minimal atherosclerotic calcification of the origin of the right renal artery. The left renal artery is unremarkable. The renal arteries are patent. IMA: Patent without evidence of aneurysm, dissection, vasculitis or significant stenosis. Inflow: Right common iliac artery endovascular stent. The stent appears patent. There is coil embolization of the right internal iliac artery. The right external iliac artery and the left iliac arteries are patent. Veins: An infrarenal IVC filter noted.  No portal venous gas. Review of the MIP images confirms the above findings. NON-VASCULAR There is no intra-abdominal free air or free fluid. Hepatobiliary: There is a 3 cm cyst in the left lobe of the liver. The liver is otherwise unremarkable. No intrahepatic biliary ductal dilatation. The gallbladder is unremarkable. Pancreas: Unremarkable. No pancreatic ductal dilatation or surrounding inflammatory changes. Spleen: Normal in size without focal abnormality. Adrenals/Urinary Tract: The adrenal glands are unremarkable. There is a 5 cm left renal interpolar cyst. The kidneys are otherwise unremarkable. There is no hydronephrosis on either side. The visualized ureters and urinary bladder appear unremarkable. Stomach/Bowel: There are scattered sigmoid diverticula without active inflammatory changes. There is moderate stool throughout the colon. No bowel obstruction or active inflammation. Normal appendix. Lymphatic: No adenopathy. Reproductive: The prostate and seminal vesicles are grossly unremarkable. Other: There is a broad-based supraumbilical ventral hernia measuring 6 cm in diameter. There is protrusion of the anterior wall of the distal  stomach. Musculoskeletal: No acute or significant osseous findings. Review of the MIP images confirms the above findings. IMPRESSION: 1. No acute intrathoracic, abdominal, or pelvic pathology. No aortic aneurysm or dissection. No CT evidence of central pulmonary artery embolus. 2. Cardiomegaly with multi vessel coronary vascular calcification and postsurgical changes of CABG. 3. Coil embolization of the right internal iliac artery. Right common iliac artery stent appears patent. Infrarenal IVC filter. 4. Sigmoid diverticulosis. No bowel obstruction or active inflammation. Normal appendix. Electronically Signed   By: Elgie Collard M.D.   On: 11/05/2017 06:47        Scheduled Meds: . atorvastatin  20 mg Oral Daily  . [START ON 11/06/2017] diphenhydrAMINE  50 mg Oral Once   Or  . [START ON 11/06/2017] diphenhydrAMINE  50 mg Intravenous Once  . enoxaparin  80 mg Subcutaneous BID  . lamoTRIgine  100 mg Oral BID  . levETIRAcetam  1,000 mg Oral BID  . losartan  100 mg Oral Daily  . metoprolol succinate  25 mg Oral Daily  . [START ON 11/06/2017] predniSONE  50 mg Oral Q6H  . sertraline  100 mg Oral Daily  . sodium chloride flush  3 mL Intravenous Q12H  . ticagrelor  90 mg Oral BID   Continuous Infusions: . sodium chloride    . [START ON  11/06/2017] sodium chloride     Followed by  . [START ON 11/06/2017] sodium chloride    . ampicillin-sulbactam (UNASYN) 1.5 g IVPB Stopped (11/05/17 0905)     LOS: 0 days       Alwyn Ren, MD [facility] If 7PM-7AM, please contact night-coverage [email or web address] Password [email or web address] 11/05/2017, 3:11 PM

## 2017-11-05 NOTE — ED Notes (Addendum)
Patient refused Unasyn because he feels it's being too close to the other administration.

## 2017-11-05 NOTE — ED Notes (Signed)
Patient says he put my picture on facebook to find out where I live. Relayed this info to charge nurse and security. He told them he does not have facebook and he is going to just walk out if he doesn't get a room upstairs soon.

## 2017-11-05 NOTE — ED Notes (Addendum)
Per MD, patient was ordered a heart healthy diet. Patient demanded snacks in the ED so I gave them at his request after he yelled at me several times. "You are such a witch! I don't understand why you are so nasty." He then proceeded to take a picture of me in a patient care area and ask for my license number. I said I do not give that out. He said "You stop running your mouth! I'm going to report your ass!" Security was called and he was told that he is not to film or take pictures in patient care areas.

## 2017-11-05 NOTE — ED Notes (Addendum)
Patient has refused vitals and will not speak with me. He is going out to his car to make sure it is locked with security.

## 2017-11-05 NOTE — CV Procedure (Signed)
2D Echo attempted but patient out of bed and said he was hungry and  would rather wait to do echo later after he has his pain meds. Will try echo at a later time.

## 2017-11-05 NOTE — ED Notes (Signed)
Pt is refusing to allow me to complete repeat EKG. Does not want to be touched at the moment.

## 2017-11-05 NOTE — ED Notes (Addendum)
This RN cannot return the lovenox due to the patient refusing it after it was opened and almost ready to give. Wasting in the sharps.

## 2017-11-05 NOTE — Progress Notes (Signed)
Per discussion with Dr. Eden EmmsNishan, last dose of lovenox should be tonight. Placed order for pharmacy help with timing of doses (saw that he refused this am's dose).  Alford HighlandAshley M Shanyn Preisler NP-C

## 2017-11-05 NOTE — ED Notes (Signed)
ED TO INPATIENT HANDOFF REPORT  Name/Age/Gender Marvin Young 57 y.o. male  Code Status    Code Status Orders  (From admission, onward)        Start     Ordered   11/04/17 2325  Full code  Continuous     11/04/17 2326    Code Status History    Date Active Date Inactive Code Status Order ID Comments User Context   This patient has a current code status but no historical code status.      Home/SNF/Other Home  Chief Complaint chest pain / vomiting / left side of body numb   Level of Care/Admitting Diagnosis ED Disposition    ED Disposition Condition Comment   Admit  Hospital Area: [facility] [[id number]]  Level of Care: Telemetry [5]  Admit to tele based on following criteria: Monitor for Ischemic changes  Diagnosis: Chest pain [546270]  Admitting Physician: Norval Morton [[id number]]  Attending Physician: Norval Morton [[id number]]  PT Class (Do Not Modify): Observation [104]  PT Acc Code (Do Not Modify): Observation [10022]       Medical History Past Medical History:  Diagnosis Date  . 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 Coronary artery disease    multiple PCI prior to and after CABG, unknown where, most recent PCI in 2017  . DVT (deep venous thrombosis) ([place])   . GI bleed   . HTN (hypertension)   . 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 MI (myocardial infarction) ([place])   . Osteomyelitis of jaw 2018  . Pulmonary emboli ([place])   . 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 Asa [Aspirin] Anaphylaxis  . Contrast Media [Iodinated Diagnostic Agents] Hives  . Sulfur Hives    IV Location/Drains/Wounds Patient Lines/Drains/Airways Status   Active Line/Drains/Airways    None          Labs/Imaging Results for orders placed or performed during the hospital encounter of 11/04/17 (from the past 48 hour(s))  Basic metabolic panel     Status: Abnormal    Collection Time: 11/04/17  8:37 PM  Result Value Ref Range   Sodium 138 135 - 145 mmol/L   Potassium 3.9 3.5 - 5.1 mmol/L   Chloride 112 (H) 101 - 111 mmol/L   CO2 20 (L) 22 - 32 mmol/L   Glucose, Bld 86 65 - 99 mg/dL   BUN 18 6 - 20 mg/dL   Creatinine, Ser 0.85 0.61 - 1.24 mg/dL   Calcium 8.6 (L) 8.9 - 10.3 mg/dL   GFR calc non Af Amer >60 >60 mL/min   GFR calc Af Amer >60 >60 mL/min    Comment: (NOTE) The eGFR has been calculated using the CKD EPI equation. This calculation has not been validated in all clinical situations. eGFR's persistently <60 mL/min signify possible Chronic Kidney Disease.    Anion gap 6 5 - 15  CBC     Status: Abnormal   Collection Time: 11/04/17  8:37 PM  Result Value Ref Range   WBC 4.9 4.0 - 10.5 K/uL   RBC 4.00 (L) 4.22 - 5.81 MIL/uL   Hemoglobin 10.1 (L) 13.0 - 17.0 g/dL   HCT 31.8 (L) 39.0 - 52.0 %   MCV 79.5 78.0 - 100.0 fL   MCH 25.3 (L) 26.0 - 34.0 pg   MCHC 31.8 30.0 - 36.0 g/dL   RDW  22.9 (H) 11.5 - 15.5 %   Platelets 154 150 - 400 K/uL  I-stat troponin, ED     Status: None   Collection Time: 11/04/17  8:49 PM  Result Value Ref Range   Troponin i, poc 0.00 0.00 - 0.08 ng/mL   Comment 3            Comment: Due to the release kinetics of cTnI, a negative result within the first hours of the onset of symptoms does not rule out myocardial infarction with certainty. If myocardial infarction is still suspected, repeat the test at appropriate intervals.   Urine rapid drug screen (hosp performed)     Status: Abnormal   Collection Time: 11/04/17 11:26 PM  Result Value Ref Range   Opiates POSITIVE (A) NONE DETECTED   Cocaine NONE DETECTED NONE DETECTED   Benzodiazepines POSITIVE (A) NONE DETECTED   Amphetamines NONE DETECTED NONE DETECTED   Tetrahydrocannabinol NONE DETECTED NONE DETECTED   Barbiturates NONE DETECTED NONE DETECTED    Comment: (NOTE) DRUG SCREEN FOR MEDICAL PURPOSES ONLY.  IF CONFIRMATION IS NEEDED FOR ANY PURPOSE,  NOTIFY LAB WITHIN 5 DAYS. LOWEST DETECTABLE LIMITS FOR URINE DRUG SCREEN Drug Class                     Cutoff (ng/mL) Amphetamine and metabolites    1000 Barbiturate and metabolites    200 Benzodiazepine                 539 Tricyclics and metabolites     300 Opiates and metabolites        300 Cocaine and metabolites        300 THC                            50   Troponin I     Status: None   Collection Time: 11/04/17 11:42 PM  Result Value Ref Range   Troponin I <0.03 <0.03 ng/mL  Lipid panel     Status: Abnormal   Collection Time: 11/05/17  3:59 AM  Result Value Ref Range   Cholesterol 139 0 - 200 mg/dL   Triglycerides 158 (H) <150 mg/dL   HDL 53 >40 mg/dL   Total CHOL/HDL Ratio 2.6 RATIO   VLDL 32 0 - 40 mg/dL   LDL Cholesterol 54 0 - 99 mg/dL    Comment:        Total Cholesterol/HDL:CHD Risk Coronary Heart Disease Risk Table                     Men   Women  1/2 Average Risk   3.4   3.3  Average Risk       5.0   4.4  2 X Average Risk   9.6   7.1  3 X Average Risk  23.4   11.0        Use the calculated Patient Ratio above and the CHD Risk Table to determine the patient's CHD Risk.        ATP III CLASSIFICATION (LDL):  <100     mg/dL   Optimal  100-129  mg/dL   Near or Above                    Optimal  130-159  mg/dL   Borderline  160-189  mg/dL   High  >190     mg/dL   Very High  Troponin I     Status: None   Collection Time: 11/05/17  3:59 AM  Result Value Ref Range   Troponin I <0.03 <0.03 ng/mL  CBC with Differential/Platelet     Status: Abnormal   Collection Time: 11/05/17  5:00 AM  Result Value Ref Range   WBC 5.4 4.0 - 10.5 K/uL   RBC 3.93 (L) 4.22 - 5.81 MIL/uL   Hemoglobin 9.7 (L) 13.0 - 17.0 g/dL   HCT 31.3 (L) 39.0 - 52.0 %   MCV 79.6 78.0 - 100.0 fL   MCH 24.7 (L) 26.0 - 34.0 pg   MCHC 31.0 30.0 - 36.0 g/dL   RDW 22.9 (H) 11.5 - 15.5 %   Platelets 139 (L) 150 - 400 K/uL   Neutrophils Relative % 82 %   Lymphocytes Relative 11 %   Monocytes  Relative 5 %   Eosinophils Relative 2 %   Basophils Relative 0 %   Neutro Abs 4.4 1.7 - 7.7 K/uL   Lymphs Abs 0.6 (L) 0.7 - 4.0 K/uL   Monocytes Absolute 0.3 0.1 - 1.0 K/uL   Eosinophils Absolute 0.1 0.0 - 0.7 K/uL   Basophils Absolute 0.0 0.0 - 0.1 K/uL   RBC Morphology POLYCHROMASIA PRESENT    WBC Morphology MILD LEFT SHIFT (1-5% METAS, OCC MYELO, OCC BANDS)   Basic metabolic panel     Status: Abnormal   Collection Time: 11/05/17  5:00 AM  Result Value Ref Range   Sodium 137 135 - 145 mmol/L   Potassium 4.3 3.5 - 5.1 mmol/L   Chloride 110 101 - 111 mmol/L   CO2 22 22 - 32 mmol/L   Glucose, Bld 120 (H) 65 - 99 mg/dL   BUN 21 (H) 6 - 20 mg/dL   Creatinine, Ser 0.93 0.61 - 1.24 mg/dL   Calcium 8.7 (L) 8.9 - 10.3 mg/dL   GFR calc non Af Amer >60 >60 mL/min   GFR calc Af Amer >60 >60 mL/min    Comment: (NOTE) The eGFR has been calculated using the CKD EPI equation. This calculation has not been validated in all clinical situations. eGFR's persistently <60 mL/min signify possible Chronic Kidney Disease.    Anion gap 5 5 - 15  Troponin I     Status: None   Collection Time: 11/05/17  5:00 AM  Result Value Ref Range   Troponin I <0.03 <0.03 ng/mL   Dg Chest 2 View  Result Date: 11/04/2017 CLINICAL DATA:  Chest pain EXAM: CHEST  2 VIEW COMPARISON:  None. FINDINGS: Cardiomegaly. Median sternotomy wires appear intact and appropriately aligned. Lungs are clear. No pleural effusion or pneumothorax seen. No acute or suspicious osseous finding. Left-sided PICC line appears well positioned with tip at the level of the SVC/cavoatrial junction. IMPRESSION: No acute findings. Cardiomegaly. No evidence of pneumonia or pulmonary edema. Electronically Signed   By: Franki Cabot M.D.   On: 11/04/2017 21:26   Ct Angio Chest/abd/pel For Dissection W And/or W/wo  Result Date: 11/05/2017 CLINICAL DATA:  56 year old male with chest pain. EXAM: CT ANGIOGRAPHY CHEST, ABDOMEN AND PELVIS TECHNIQUE:  Multidetector CT imaging through the chest, abdomen and pelvis was performed using the standard protocol during bolus administration of intravenous contrast. Multiplanar reconstructed images and MIPs were obtained and reviewed to evaluate the vascular anatomy. CONTRAST:  100 cc Isovue 370 COMPARISON:  Chest radiograph dated 11/04/2017 FINDINGS: CTA CHEST FINDINGS Cardiovascular: There is mild cardiomegaly. There is coronary vascular calcification primarily involving the LAD and postsurgical changes of  CABG. No pericardial effusion. Minimally dilated ascending aorta measures 4.3 cm in diameter. The thoracic aorta is otherwise unremarkable. No aneurysmal dilatation or evidence of dissection. Evaluation of the pulmonary arteries is limited due to respiratory motion artifact and suboptimal opacification of the peripheral branches. No large or central pulmonary artery embolus identified. Left-sided PICC with tip in the central SVC close to the cavoatrial junction. Mediastinum/Nodes: There is no hilar or mediastinal adenopathy. Esophagus is grossly unremarkable. No mediastinal fluid collection. Lungs/Pleura: Mild diffuse interstitial prominence and hazy and ground-glass airspace densities noted which may represent atelectatic changes or mild interstitial edema. Clinical correlation is recommended. There is no focal consolidation, pleural effusion, or pneumothorax. The central airways are patent. Musculoskeletal: Median sternotomy wires noted. No acute osseous pathology. Review of the MIP images confirms the above findings. CTA ABDOMEN AND PELVIS FINDINGS VASCULAR Aorta: Borderline ectasia infrarenal aorta measures 2 cm in diameter. There is no aneurysmal dilatation or evidence of dissection. Celiac: Patent without evidence of aneurysm, dissection, vasculitis or significant stenosis. SMA: Patent without evidence of aneurysm, dissection, vasculitis or significant stenosis. Renals: Minimal atherosclerotic calcification of  the origin of the right renal artery. The left renal artery is unremarkable. The renal arteries are patent. IMA: Patent without evidence of aneurysm, dissection, vasculitis or significant stenosis. Inflow: Right common iliac artery endovascular stent. The stent appears patent. There is coil embolization of the right internal iliac artery. The right external iliac artery and the left iliac arteries are patent. Veins: An infrarenal IVC filter noted.  No portal venous gas. Review of the MIP images confirms the above findings. NON-VASCULAR There is no intra-abdominal free air or free fluid. Hepatobiliary: There is a 3 cm cyst in the left lobe of the liver. The liver is otherwise unremarkable. No intrahepatic biliary ductal dilatation. The gallbladder is unremarkable. Pancreas: Unremarkable. No pancreatic ductal dilatation or surrounding inflammatory changes. Spleen: Normal in size without focal abnormality. Adrenals/Urinary Tract: The adrenal glands are unremarkable. There is a 5 cm left renal interpolar cyst. The kidneys are otherwise unremarkable. There is no hydronephrosis on either side. The visualized ureters and urinary bladder appear unremarkable. Stomach/Bowel: There are scattered sigmoid diverticula without active inflammatory changes. There is moderate stool throughout the colon. No bowel obstruction or active inflammation. Normal appendix. Lymphatic: No adenopathy. Reproductive: The prostate and seminal vesicles are grossly unremarkable. Other: There is a broad-based supraumbilical ventral hernia measuring 6 cm in diameter. There is protrusion of the anterior wall of the distal stomach. Musculoskeletal: No acute or significant osseous findings. Review of the MIP images confirms the above findings. IMPRESSION: 1. No acute intrathoracic, abdominal, or pelvic pathology. No aortic aneurysm or dissection. No CT evidence of central pulmonary artery embolus. 2. Cardiomegaly with multi vessel coronary vascular  calcification and postsurgical changes of CABG. 3. Coil embolization of the right internal iliac artery. Right common iliac artery stent appears patent. Infrarenal IVC filter. 4. Sigmoid diverticulosis. No bowel obstruction or active inflammation. Normal appendix. Electronically Signed   By: Anner Crete M.D.   On: 11/05/2017 06:47    Pending Labs Unresulted Labs (From admission, onward)   Start     Ordered   11/06/17 0500  Protime-INR  Tomorrow morning,   STAT     11/05/17 1154   11/06/17 0500  CBC  Tomorrow morning,   STAT     11/05/17 1154   11/06/17 7106  Basic metabolic panel  Tomorrow morning,   R     11/05/17 1158   11/04/17 2324  HIV antibody (Routine Testing)  Once,   R     11/04/17 2326      Vitals/Pain Today's Vitals   11/05/17 1030 11/05/17 1100 11/05/17 1200 11/05/17 1256  BP: (!) 165/79 (!) 132/106    Pulse: 67 70    Resp: 17 20    Temp:      TempSrc:      SpO2: 98% 96%    Weight:      Height:      PainSc:   10-Worst pain ever 10-Worst pain ever    Isolation Precautions No active isolations  Medications Medications  atorvastatin (LIPITOR) tablet 20 mg (20 mg Oral Given 11/05/17 1024)  enoxaparin (LOVENOX) injection 80 mg (80 mg Subcutaneous Refused 11/05/17 1023)  levETIRAcetam (KEPPRA) tablet 1,000 mg (1,000 mg Oral Given 11/05/17 1023)  lamoTRIgine (LAMICTAL) tablet 100 mg (100 mg Oral Given 11/05/17 1022)  acetaminophen (TYLENOL) tablet 650 mg (not administered)  ondansetron (ZOFRAN) injection 4 mg (not administered)  morphine 2 MG/ML injection 2 mg (2 mg Intravenous Given 11/05/17 1219)  gi cocktail (Maalox,Lidocaine,Donnatal) (not administered)  nitroGLYCERIN (NITROSTAT) SL tablet 0.4 mg (not administered)  diphenhydrAMINE (BENADRYL) injection 50 mg (50 mg Intravenous Given 11/05/17 1219)  losartan (COZAAR) tablet 100 mg (100 mg Oral Given 11/05/17 1022)  metoprolol succinate (TOPROL-XL) 24 hr tablet 25 mg (25 mg Oral Given 11/05/17 1023)  sertraline  (ZOLOFT) tablet 100 mg (100 mg Oral Given 11/05/17 1022)  temazepam (RESTORIL) capsule 30 mg (not administered)  ticagrelor (BRILINTA) tablet 90 mg (90 mg Oral Given 11/05/17 1023)  ampicillin-sulbactam (UNASYN) 1.5 g in sodium chloride 0.9 % 50 mL IVPB (0 g Intravenous Stopped 11/05/17 0905)  ondansetron (ZOFRAN) injection 4 mg (4 mg Intravenous Given 11/04/17 2055)  morphine 4 MG/ML injection 4 mg (4 mg Intravenous Given 11/04/17 2056)  morphine 4 MG/ML injection 4 mg (4 mg Intravenous Given 11/04/17 2222)  hydrocortisone sodium succinate (SOLU-CORTEF) 100 MG injection 200 mg (200 mg Intravenous Given 11/05/17 0205)  diphenhydrAMINE (BENADRYL) capsule 50 mg ( Oral See Alternative 11/05/17 0458)    Or  diphenhydrAMINE (BENADRYL) injection 50 mg (50 mg Intravenous Given 11/05/17 0458)  iopamidol (ISOVUE-370) 76 % injection 100 mL (100 mLs Intravenous Contrast Given 11/05/17 1224)    Mobility walks with device

## 2017-11-05 NOTE — ED Notes (Signed)
Patient insisted on walking to 4th floor

## 2017-11-05 NOTE — ED Notes (Signed)
Bed: [id number] Expected date:  Expected time:  Means of arrival:  Comments: Hold 1-8

## 2017-11-05 NOTE — Consult Note (Signed)
Cardiology Consultation:   Patient ID: Marvin Young; [id number]; 1961-08-03   Admit date: 11/04/2017 Date of Consult: 11/05/2017  Primary Care Provider: System, Pcp Not In [facility] [place] [place]  Chief Complaint: Chest Pain  Patient Profile:   Marvin CopasMicheal Young is a 57 y.o. 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  History of Present Illness:   Marvin Young has an extensive medical history as above, but no records on file for comparison. His CABG was done at [facility] in [place]. He moved to [place] about 1 year ago for personal reasons and [facility] [place] Primary team has [facility]. He is currently being treated for osteomyelitis of the jaw with IV unasyn and has a double lumen PICC (has 3 more weeks of treatment). He also reports right iliac artery aneurysm repair with coiling last week, so he is not on his normal regimen of warfarin and is taking SQ lovenox (last dose today at 01:00).    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 During history-taking, he has not made eye contact and provides very brief responses. He has refused EKG and keeps leaving the room to go smoke per nursing staff.   He started having CP yesterday afternoon that radiated to the jaw with associated SOB, nausea, vomiting, diaphoresis, and lightheadedness. He took 4 SL nitro, which helped some, but did not relieve the pain. The CP got worse, so he presented to the ED. Nothing seems to alleviate it, but it  does come and go. He has received 4 doses of morphine overnight. He describes it as similar to previous angina. His last PCI was in 2017, unknown what was stented. Of note, he is allergic to aspirin (anaphylaxis) and takes brilinta. He also reports some vague LE edema. He is currently having left-sided CP 6/10 with associated SOB.   His hemoglobin is 9.7 and platelets 139. He reports a history of GI bleed, but denies any bloody stools or melana. He did have surgery last week. No labs for comparison.   Pertinent admission labs include: troponin negative x4, UDS positive for benzos and opiates (received morphine prior and takes temazepam at home), hemoglobin 10.1 -> 9.7, platelets 139, CT angio of chest/abd: no aortic aneurysm or dissection, no evidence of PE, cardiomegaly with multi vessel calcification, postsurgical changes of CABG, coil embolization of right internal iliac artery with patet stent in right common iliac artery, infrarenal IVC filter, and sigmoid diverticulosis.    Past Medical History:  Diagnosis Date  . 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 Coronary artery disease    multiple PCI prior to and after CABG, unknown where, most recent PCI in 2017  . DVT (deep venous thrombosis) (HCC)   . GI bleed   . HTN (hypertension)   . 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 MI (myocardial infarction) (HCC)   . Osteomyelitis of jaw 2018  . Pulmonary emboli (  HCC)   . Seizure after head injury (HCC) 2006    Past Surgical History:  Procedure Laterality Date  . ANEURYSM COILING Right 10/29/2017   iliac artery  . CORONARY ARTERY BYPASS GRAFT  2014   3 vessel  . IVC FILTER INSERTION    . PERCUTANEOUS CORONARY STENT INTERVENTION (PCI-S)     most recently 2017 at [facility] unknown details     Inpatient Medications: Scheduled Meds: . atorvastatin  20 mg Oral Daily  . enoxaparin  80 mg Subcutaneous BID  . lamoTRIgine  100 mg Oral BID  .  levETIRAcetam  1,000 mg Oral BID  . losartan  100 mg Oral Daily  . metoprolol succinate  25 mg Oral Daily  . sertraline  100 mg Oral Daily  . ticagrelor  90 mg Oral Daily   Continuous Infusions: . ampicillin-sulbactam (UNASYN) 1.5 g IVPB Stopped (11/05/17 0905)   PRN Meds: acetaminophen, diphenhydrAMINE, gi cocktail, morphine injection, nitroGLYCERIN, ondansetron (ZOFRAN) IV, temazepam  Home Meds: Prior to Admission medications   Medication Sig Start Date End Date Taking? Authorizing Provider  ampicillin-sulbactam 1.5 g in sodium chloride 0.9 % 50 mL Inject 1.5 g into the vein every 6 (six) hours.   Yes [provider]  atorvastatin (LIPITOR) 20 MG tablet Take 20 mg by mouth daily.   Yes [provider]  diphenhydrAMINE (BENADRYL) 50 MG/ML injection Inject 50 mg into the vein every 6 (six) hours as needed for itching.    Yes [provider]  enoxaparin (LOVENOX) 80 MG/0.8ML injection Inject 80 mg into the skin every 12 (twelve) hours.   Yes [provider]  lamoTRIgine (LAMICTAL) 100 MG tablet Take 100 mg by mouth 2 (two) times daily.   Yes [provider]  levETIRAcetam (KEPPRA) 1000 MG tablet Take 1,000 mg by mouth 2 (two) times daily.   Yes [provider]  losartan (COZAAR) 100 MG tablet Take 100 mg by mouth daily.   Yes [provider]  metoprolol succinate (TOPROL-XL) 25 MG 24 hr tablet Take 25 mg by mouth daily.   Yes [provider]  sertraline (ZOLOFT) 100 MG tablet Take 100 mg by mouth daily.   Yes [provider]  temazepam (RESTORIL) 30 MG capsule Take 30 mg by mouth at bedtime as needed for sleep.   Yes [provider]  ticagrelor (BRILINTA) 90 MG TABS tablet Take 90 mg by mouth daily.   Yes [provider]    Allergies:    Allergies  Allergen Reactions  . Asa [Aspirin] Anaphylaxis  . Contrast Media [Iodinated Diagnostic Agents] Hives  . Sulfur Hives    Social History:     Social History   Socioeconomic History  . Marital status: Divorced    Spouse name: Not on file  . Number of children: Not on file  . Years of education: Not on file  . Highest education level: Not on file  Social Needs  . Financial resource strain: Not on file  . Food insecurity - worry: Not on file  . Food insecurity - inability: Not on file  . Transportation needs - medical: Not on file  . Transportation needs - non-medical: Not on file  Occupational History  . Occupation: travel Engineer, civil (consulting)  Tobacco Use  . Smoking status: Current Every Day Smoker    Types: Cigarettes  . Smokeless tobacco: Never Used  . Tobacco comment: 3 cigarettes/day  Substance and Sexual Activity  . Alcohol use: No    Frequency: Never  .  Drug use: No  . Sexual activity: Not on file  Other Topics Concern  . Not on file  Social History Narrative  . Not on file    Family History:   The patient's family history is negative for Diabetes.  ROS:  Please see the history of present illness.  All other ROS reviewed and negative.     Physical Exam/Data:   Vitals:   11/05/17 0730 11/05/17 0800 11/05/17 0830 11/05/17 0900  BP: (!) 169/77 (!) 148/65 129/73 119/74  Pulse: 68 63 76 65  Resp: 14 15 12 13   Temp:      TempSrc:      SpO2: 96% 98% (!) 89% 98%  Weight:      Height:        Intake/Output Summary (Last 24 hours) at 11/05/2017 1026 Last data filed at 11/05/2017 0905 Gross per 24 hour  Intake 100 ml  Output -  Net 100 ml   Filed Weights   11/04/17 2224  Weight: 183 lb (83 kg)   Body mass index is 27.83 kg/m.  General: Well developed, well nourished, in no acute distress. In bed on phone.  Head: Normocephalic, atraumatic, sclera non-icteric, no xanthomas, nares are without discharge.  Neck: Negative for carotid bruits. JVD not elevated. Lungs: Clear bilaterally to auscultation without wheezes, rales, or rhonchi. Breathing is unlabored. Heart: RRR with S1 S2. No murmurs, rubs, or gallops  appreciated. Abdomen: Obese, soft, non-tender, non-distended with normoactive bowel sounds. No hepatomegaly. No rebound/guarding. No obvious abdominal masses. Msk:  Strength and tone appear normal for age. Extremities: No clubbing or cyanosis. No edema.  Distal pedal pulses are 2+ and equal bilaterally. LUE DL PICC Neuro: Alert and oriented X 3. No facial asymmetry. No focal deficit. Moves all extremities spontaneously. Psych:  Responds to questions briefly. Flat affect. Does not make eye contact.  EKG:  Pt has refused EKG per nursing staff. Telemetry showed NSR 60's. No ectopy.  Relevant CV Studies: Refused EKG. Echo ordered.  Laboratory Data:  Chemistry Recent Labs  Lab 11/04/17 2037 11/05/17 0500  NA 138 137  K 3.9 4.3  CL 112* 110  CO2 20* 22  GLUCOSE 86 120*  BUN 18 21*  CREATININE 0.85 0.93  CALCIUM 8.6* 8.7*  GFRNONAA >60 >60  GFRAA >60 >60  ANIONGAP 6 5    No results for input(s): PROT, ALBUMIN, AST, ALT, ALKPHOS, BILITOT in the last 168 hours. Hematology Recent Labs  Lab 11/04/17 2037 11/05/17 0500  WBC 4.9 5.4  RBC 4.00* 3.93*  HGB 10.1* 9.7*  HCT 31.8* 31.3*  MCV 79.5 79.6  MCH 25.3* 24.7*  MCHC 31.8 31.0  RDW 22.9* 22.9*  PLT 154 139*   Cardiac Enzymes Recent Labs  Lab 11/04/17 2342 11/05/17 0359 11/05/17 0500  TROPONINI <0.03 <0.03 <0.03    Recent Labs  Lab 11/04/17 2049  TROPIPOC 0.00    BNPNo results for input(s): BNP, PROBNP in the last 168 hours.  DDimer No results for input(s): DDIMER in the last 168 hours.  Radiology/Studies:  Dg Chest 2 View  Result Date: 11/04/2017 CLINICAL DATA:  Chest pain EXAM: CHEST  2 VIEW COMPARISON:  None. FINDINGS: Cardiomegaly. Median sternotomy wires appear intact and appropriately aligned. Lungs are clear. No pleural effusion or pneumothorax seen. No acute or suspicious osseous finding. Left-sided PICC line appears well positioned with tip at the level of the SVC/cavoatrial junction. IMPRESSION: No  acute findings. Cardiomegaly. No evidence of pneumonia or pulmonary edema. Electronically Signed  By: Bary Richard M.D.   On: 11/04/2017 21:26   Ct Angio Chest/abd/pel For Dissection W And/or W/wo  Result Date: 11/05/2017 CLINICAL DATA:  57 year old male with chest pain. EXAM: CT ANGIOGRAPHY CHEST, ABDOMEN AND PELVIS TECHNIQUE: Multidetector CT imaging through the chest, abdomen and pelvis was performed using the standard protocol during bolus administration of intravenous contrast. Multiplanar reconstructed images and MIPs were obtained and reviewed to evaluate the vascular anatomy. CONTRAST:  100 cc Isovue 370 COMPARISON:  Chest radiograph dated 11/04/2017 FINDINGS: CTA CHEST FINDINGS Cardiovascular: There is mild cardiomegaly. There is coronary vascular calcification primarily involving the LAD and postsurgical changes of CABG. No pericardial effusion. Minimally dilated ascending aorta measures 4.3 cm in diameter. The thoracic aorta is otherwise unremarkable. No aneurysmal dilatation or evidence of dissection. Evaluation of the pulmonary arteries is limited due to respiratory motion artifact and suboptimal opacification of the peripheral branches. No large or central pulmonary artery embolus identified. Left-sided PICC with tip in the central SVC close to the cavoatrial junction. Mediastinum/Nodes: There is no hilar or mediastinal adenopathy. Esophagus is grossly unremarkable. No mediastinal fluid collection. Lungs/Pleura: Mild diffuse interstitial prominence and hazy and ground-glass airspace densities noted which may represent atelectatic changes or mild interstitial edema. Clinical correlation is recommended. There is no focal consolidation, pleural effusion, or pneumothorax. The central airways are patent. Musculoskeletal: Median sternotomy wires noted. No acute osseous pathology. Review of the MIP images confirms the above findings. CTA ABDOMEN AND PELVIS FINDINGS VASCULAR Aorta: Borderline ectasia  infrarenal aorta measures 2 cm in diameter. There is no aneurysmal dilatation or evidence of dissection. Celiac: Patent without evidence of aneurysm, dissection, vasculitis or significant stenosis. SMA: Patent without evidence of aneurysm, dissection, vasculitis or significant stenosis. Renals: Minimal atherosclerotic calcification of the origin of the right renal artery. The left renal artery is unremarkable. The renal arteries are patent. IMA: Patent without evidence of aneurysm, dissection, vasculitis or significant stenosis. Inflow: Right common iliac artery endovascular stent. The stent appears patent. There is coil embolization of the right internal iliac artery. The right external iliac artery and the left iliac arteries are patent. Veins: An infrarenal IVC filter noted.  No portal venous gas. Review of the MIP images confirms the above findings. NON-VASCULAR There is no intra-abdominal free air or free fluid. Hepatobiliary: There is a 3 cm cyst in the left lobe of the liver. The liver is otherwise unremarkable. No intrahepatic biliary ductal dilatation. The gallbladder is unremarkable. Pancreas: Unremarkable. No pancreatic ductal dilatation or surrounding inflammatory changes. Spleen: Normal in size without focal abnormality. Adrenals/Urinary Tract: The adrenal glands are unremarkable. There is a 5 cm left renal interpolar cyst. The kidneys are otherwise unremarkable. There is no hydronephrosis on either side. The visualized ureters and urinary bladder appear unremarkable. Stomach/Bowel: There are scattered sigmoid diverticula without active inflammatory changes. There is moderate stool throughout the colon. No bowel obstruction or active inflammation. Normal appendix. Lymphatic: No adenopathy. Reproductive: The prostate and seminal vesicles are grossly unremarkable. Other: There is a broad-based supraumbilical ventral hernia measuring 6 cm in diameter. There is protrusion of the anterior wall of the distal  stomach. Musculoskeletal: No acute or significant osseous findings. Review of the MIP images confirms the above findings. IMPRESSION: 1. No acute intrathoracic, abdominal, or pelvic pathology. No aortic aneurysm or dissection. No CT evidence of central pulmonary artery embolus. 2. Cardiomegaly with multi vessel coronary vascular calcification and postsurgical changes of CABG. 3. Coil embolization of the right internal iliac artery. Right common iliac artery  stent appears patent. Infrarenal IVC filter. 4. Sigmoid diverticulosis. No bowel obstruction or active inflammation. Normal appendix. Electronically Signed   By: Elgie Collard M.D.   On: 11/05/2017 06:47    Assessment and Plan:   1. CAD with chest pain - details of cardiac history are not known, but he has had a CABG and PCI prior to and after CABG in 2014. He is allergic to aspirin, so he is only on brilinta at home. Troponins have all been negative (x4), but he is still having ongoing CP with SOB. Hx of PE, but has been on lovenox since last week (normally on coumadin, but had surgery) and CTA negative. He reports having a descending aortic aneurysm, but not visualized during CTA. He has refused EKG. Other risk factors include HTN and ongoing toboacco use. He is on a beta blocker (Toprol XL 25 mg daily). Will discuss ischemic work up with Dr. Eden Emms. Records have been requested by primary team. Echo has been ordered.  2. Hx of DVT's and PE's  - on warfarin typically, managed by [facility], but currently on Lovenox on coiling of right iliac artery aneurism last week. CTA negative for PE. He also has IVC filter. Vague history of occasional swelling in BLE, but no edema currently.   3. Thrombocytopenia  - Hemoglobin is 9.7, platelets 139. He reports vague history of GI bleed, but no bloody stools or melana recently. Lower hemoglobin could potentially be contributing to CP, but it is not extremely low. He had surgery last week, but wouldn't except  that much blood loss. He is also on brilinta and lovenox, but reports to bleeding. Will monitor CBC.  4. Osteomyelitis of the jaw - continue IV unasyn per primary team. WBC 5.4. Afebrile.   5. HTN - SBP 110-160's. Suspect some of this is related to pain/agitation. Continue Toprol xl 25 mg daily and Losartan 100 mg daily. May need to add an additional agent (or switch metop to coreg) if he remains hypertensive.   6. Seizures after TBI - continue antiepileptics per primary team. No seizure activity since 2006.  7. ? 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 Outside records requested per primary team.    For questions or updates, please contact [facility] Please consult [email or web address] for contact info under Cardiology/STEMI.    Signed, Alford Highland, NP  11/05/2017 10:26 AM   Patient examined chart reviewed. Patient more appropriate with me. I think he is smart and manipulative with nursing Background and understands how ER;s work. Intermittent pain continues He has consented to ECG finally  Exam remarkable for clear lungs no murmur recent RFA coiling of ruptured aneurysm good LFA puls Fair left radial pulse Given extensive history of CABG and post CABG stenting favor diagnostic cath likely from left FA Patient prefers this As well as he indicates having multiple false positive nuclear studies in past.   Charlton Haws

## 2017-11-05 NOTE — Progress Notes (Addendum)
Received consult from Cardiology to assist with following up with timing of lovenox and stop after tonight's dose for cardiac cath tomorrow. Note patient refused this AM's dose. Spoke with patient's primary RN, who stated patient would only accept at 0900 and 2100. Therefore, have adjusted timing of doses to 0900 and 2100 and placed stop time of tonight at 2359 per orders from Cardiology. Current dosing of 80mg  SQ q12h is appropriate for indication (hx of DVT/PE, while warfarin on hold due to surgery last week), weight, and renal function. Will need to monitor CBC closely due to decreased Hgb/Pltc. No bleeding currently reported.   Greer PickerelJigna Naziya Hegwood, PharmD, BCPS Pager: [phone] 11/05/2017 1:39 PM

## 2017-11-05 NOTE — ED Notes (Signed)
Pt has been verbally abusive to nurses and staff. Told primary nurse Jon GillsAlexis RN he was posting her picture on facebook to find out more about her and report her to the board of nursing. Writer spoke with security and [employer] to see if any action can be taken to protect the nurse. Writer spoke with pt who states he does not have face book. Pt states [facility] the [facility] for a cardiac cath. He states" I will call my daughter and have her take me over" Writer explained, it is safer for us to arrange transport due to his health condition and we need to confirm room for him. He stated "I will just sit in lobby until a room comes open" Writer explained, it is not safe to go sit in lobby. Pt is unsure if he will stay or leave. Writer [facility] to update situation. [facility] has looked up policy regarding phones and pictures, also notified Risk Management. Pt will get 1st available room assigned. She as well as Clinical research associatewriter are concerned about the way he is and will continue to treat staff.

## 2017-11-05 NOTE — ED Notes (Signed)
CT notified of medication administration.

## 2017-11-05 NOTE — ED Notes (Signed)
The pt stated that he was going to the [facility]i machines in the lobby, this RN stated that the pt is NPO and cannot have anything by mouth. The pt said that he was going to go outside for fresh air. This RN stated that that was against policy and the pt needed to stay in his room. The pt stated "Well send security after me then." Hospitalist notified and the pt was notified that the hospitalist stated that if he left again he would be discharged AMA and he would have to start the process over. The pt agreed and understood.

## 2017-11-06 ENCOUNTER — Encounter (HOSPITAL_COMMUNITY)
Admission: EM | Disposition: A | Discharge status: Home / Self Care | Payer: Self-pay | Source: Home / Self Care | Attending: Nephrology

## 2017-11-06 ENCOUNTER — Inpatient Hospital Stay (HOSPITAL_COMMUNITY): Payer: Self-pay

## 2017-11-06 ENCOUNTER — Other Ambulatory Visit (HOSPITAL_COMMUNITY): Payer: Self-pay

## 2017-11-06 DIAGNOSIS — I340 Nonrheumatic mitral (valve) insufficiency: Secondary | ICD-10-CM

## 2017-11-06 DIAGNOSIS — R072 Precordial pain: Secondary | ICD-10-CM

## 2017-11-06 DIAGNOSIS — I723 Aneurysm of iliac artery: Secondary | ICD-10-CM

## 2017-11-06 DIAGNOSIS — I200 Unstable angina: Secondary | ICD-10-CM

## 2017-11-06 HISTORY — PX: LEFT HEART CATH AND CORS/GRAFTS ANGIOGRAPHY: CATH118250

## 2017-11-06 LAB — HIV ANTIBODY (ROUTINE TESTING W REFLEX): HIV SCREEN 4TH GENERATION: NONREACTIVE

## 2017-11-06 LAB — PROTIME-INR
INR: 0.95
Prothrombin Time: 12.5 seconds (ref 11.4–15.2)

## 2017-11-06 LAB — BASIC METABOLIC PANEL
Anion gap: 5 (ref 5–15)
BUN: 22 mg/dL — AB (ref 6–20)
CALCIUM: 8.5 mg/dL — AB (ref 8.9–10.3)
CO2: 25 mmol/L (ref 22–32)
CREATININE: 0.87 mg/dL (ref 0.61–1.24)
Chloride: 108 mmol/L (ref 101–111)
GFR calc Af Amer: >60 mL/min (ref >60)
GFR calc non Af Amer: >60 mL/min (ref >60)
Glucose, Bld: 137 mg/dL — ABNORMAL HIGH (ref 65–99)
Potassium: 4.3 mmol/L (ref 3.5–5.1)
Sodium: 138 mmol/L (ref 135–145)

## 2017-11-06 LAB — CBC
HEMATOCRIT: 30.3 % — AB (ref 39.0–52.0)
Hemoglobin: 9.6 g/dL — ABNORMAL LOW (ref 13.0–17.0)
MCH: 25.6 pg — AB (ref 26.0–34.0)
MCHC: 31.7 g/dL (ref 30.0–36.0)
MCV: 80.8 fL (ref 78.0–100.0)
Platelets: 128 10*3/uL — ABNORMAL LOW (ref 150–400)
RBC: 3.75 MIL/uL — ABNORMAL LOW (ref 4.22–5.81)
RDW: 23.1 % — AB (ref 11.5–15.5)
WBC: 4 10*3/uL (ref 4.0–10.5)

## 2017-11-06 LAB — ECHOCARDIOGRAM COMPLETE
Height: 68 in
WEIGHTICAEL: 2960 [oz_av]

## 2017-11-06 SURGERY — LEFT HEART CATH AND CORS/GRAFTS ANGIOGRAPHY
Anesthesia: LOCAL

## 2017-11-06 MED ORDER — DIPHENHYDRAMINE HCL 50 MG/ML IJ SOLN
INTRAMUSCULAR | Status: DC | PRN
  Administered 2017-11-06: 25 mg via INTRAVENOUS

## 2017-11-06 MED ORDER — IOPAMIDOL (ISOVUE-370) INJECTION 76%
INTRAVENOUS | Status: DC | PRN
  Administered 2017-11-06: 60 mL via INTRA_ARTERIAL

## 2017-11-06 MED ORDER — FENTANYL CITRATE (PF) 100 MCG/2ML IJ SOLN
INTRAMUSCULAR | Status: AC
  Filled 2017-11-06: qty 2

## 2017-11-06 MED ORDER — LORAZEPAM 2 MG/ML IJ SOLN
2.0000 mg | Freq: Four times a day (QID) | INTRAMUSCULAR | Status: DC | PRN
  Administered 2017-11-06 – 2017-11-11 (×14): 2 mg via INTRAVENOUS
  Filled 2017-11-06 (×13): qty 1

## 2017-11-06 MED ORDER — LORAZEPAM 2 MG/ML IJ SOLN
INTRAMUSCULAR | Status: AC
  Filled 2017-11-06: qty 1

## 2017-11-06 MED ORDER — HEPARIN (PORCINE) IN NACL 2-0.9 UNIT/ML-% IJ SOLN
INTRAMUSCULAR | Status: AC | PRN
  Administered 2017-11-06: 1000 mL

## 2017-11-06 MED ORDER — SODIUM CHLORIDE 0.9% FLUSH: 3.0000 mL | INTRAVENOUS | Status: DC | PRN

## 2017-11-06 MED ORDER — MIDAZOLAM HCL 2 MG/2ML IJ SOLN
INTRAMUSCULAR | Status: AC
  Filled 2017-11-06: qty 2

## 2017-11-06 MED ORDER — DIPHENHYDRAMINE HCL 50 MG/ML IJ SOLN
INTRAMUSCULAR | Status: AC
  Filled 2017-11-06: qty 1

## 2017-11-06 MED ORDER — IOPAMIDOL (ISOVUE-370) INJECTION 76%
INTRAVENOUS | Status: AC
  Filled 2017-11-06: qty 100

## 2017-11-06 MED ORDER — LIDOCAINE HCL (PF) 1 % IJ SOLN
INTRAMUSCULAR | Status: AC
  Filled 2017-11-06: qty 30

## 2017-11-06 MED ORDER — LIDOCAINE HCL (PF) 1 % IJ SOLN
INTRAMUSCULAR | Status: DC | PRN
  Administered 2017-11-06: 15 mL

## 2017-11-06 MED ORDER — MIDAZOLAM HCL 2 MG/2ML IJ SOLN
INTRAMUSCULAR | Status: DC | PRN
  Administered 2017-11-06: 2 mg via INTRAVENOUS
  Administered 2017-11-06: 1 mg via INTRAVENOUS

## 2017-11-06 MED ORDER — HEPARIN (PORCINE) IN NACL 2-0.9 UNIT/ML-% IJ SOLN
INTRAMUSCULAR | Status: AC
  Filled 2017-11-06: qty 1000

## 2017-11-06 MED ORDER — SODIUM CHLORIDE 0.9 % IV SOLN: INTRAVENOUS | Status: AC

## 2017-11-06 MED ORDER — SODIUM CHLORIDE 0.9 % IV SOLN: 250.0000 mL | INTRAVENOUS | Status: DC | PRN

## 2017-11-06 MED ORDER — SODIUM CHLORIDE 0.9% FLUSH
3.0000 mL | Freq: Two times a day (BID) | INTRAVENOUS | Status: DC
  Administered 2017-11-07 – 2017-11-09 (×5): 3 mL via INTRAVENOUS

## 2017-11-06 MED ORDER — FENTANYL CITRATE (PF) 100 MCG/2ML IJ SOLN
INTRAMUSCULAR | Status: DC | PRN
  Administered 2017-11-06: 25 ug via INTRAVENOUS
  Administered 2017-11-06: 50 ug via INTRAVENOUS

## 2017-11-06 SURGICAL SUPPLY — 10 items
CATH INFINITI MULTIPACK ANG 4F (CATHETERS) ×2 IMPLANT
COVER PRB 48X5XTLSCP FOLD TPE (BAG) IMPLANT
COVER PROBE 5X48 (BAG)
KIT HEART LEFT (KITS) ×2 IMPLANT
PACK CARDIAC CATHETERIZATION (CUSTOM PROCEDURE TRAY) ×2 IMPLANT
SHEATH PINNACLE 4F 10CM (SHEATH) ×2 IMPLANT
TRANSDUCER W/STOPCOCK (MISCELLANEOUS) ×2 IMPLANT
WIRE EMERALD 3MM-J .035X150CM (WIRE) ×2 IMPLANT
WIRE EMERALD ST .035X260CM (WIRE) ×2 IMPLANT
WIRE HI TORQ VERSACORE-J 145CM (WIRE) ×2 IMPLANT

## 2017-11-06 NOTE — Progress Notes (Signed)
Pt disconnected self from PICC line. Pt standing in hallway and states "I need to go outside for some fresh air. I have bad PTSD" This nurse educated pt in regards of disconnecting PICC line and the dangers of introducing bacteria into the bloodstream. Pt also informed that he is on bedrest with bathroom privileges. Pt rude with staff and persistent about leaving. Security called at this time to escort pt outside. On call provider Bruna PotterBlount, NP made aware of situation. Clista BernhardtJoe Perez, house supervisor made aware of situation and at bedside talking to pt. Will continue to monitor closely.

## 2017-11-06 NOTE — H&P (View-Only) (Signed)
 Progress Note  Patient Name: Marvin Young Date of Encounter: 11/06/2017  Primary Cardiologist: No primary care provider on file.   Subjective   Pt lying flat in bed without any difficulty. He admits to continued intermittent chest pressure over night, not related to activity, lasting from 5 minutes to 40 minutes. Has been difficult with staff. Currently he is calm and agrees to proceed with cath.   Inpatient Medications    Scheduled Meds: . atorvastatin  20 mg Oral Daily  . diphenhydrAMINE  50 mg Oral Once   Or  . diphenhydrAMINE  50 mg Intravenous Once  . lamoTRIgine  100 mg Oral BID  . levETIRAcetam  1,000 mg Oral BID  . losartan  100 mg Oral Daily  . metoprolol succinate  25 mg Oral Daily  . predniSONE  50 mg Oral Q6H  . sertraline  100 mg Oral Daily  . sodium chloride flush  3 mL Intravenous Q12H  . ticagrelor  90 mg Oral BID   Continuous Infusions: . sodium chloride    . sodium chloride    . ampicillin-sulbactam (UNASYN) 1.5 g IVPB Stopped (11/06/17 0539)   PRN Meds: sodium chloride, acetaminophen, diphenhydrAMINE, gi cocktail, morphine injection, nitroGLYCERIN, ondansetron (ZOFRAN) IV, sodium chloride flush, sodium chloride flush, temazepam   Vital Signs    Vitals:   11/05/17 1451 11/05/17 2125 11/05/17 2357 11/06/17 0442  BP: (!) 163/101 (!) 127/57 (!) 152/65 (!) 151/52  Pulse: 99 81 84 78  Resp: 14 16  16  Temp: 98.1 F (36.7 C) 98 F (36.7 C) 98.3 F (36.8 C) 98.2 F (36.8 C)  TempSrc: Oral Oral Oral Oral  SpO2: 100% 99% 98% 98%  Weight: 185 lb (83.9 kg)     Height: 5' 8" (1.727 m)       Intake/Output Summary (Last 24 hours) at 11/06/2017 0802 Last data filed at 11/06/2017 0600 Gross per 24 hour  Intake 220 ml  Output 0 ml  Net 220 ml   Filed Weights   11/04/17 2224 11/05/17 1451  Weight: 183 lb (83 kg) 185 lb (83.9 kg)    Telemetry    SR 60's-70's - Personally Reviewed  ECG    No new tracing - Personally Reviewed  Physical Exam    GEN: No acute distress.   Neck: No JVD Cardiac: RRR, no murmurs, rubs, or gallops.  Respiratory: Clear to auscultation bilaterally. GI: Soft, nontender, non-distended  MS: No edema; No deformity. Neuro:  Nonfocal  Psych: Normal affect   Labs    Chemistry Recent Labs  Lab 11/04/17 2037 11/05/17 0500 11/06/17 0445  NA 138 137 138  K 3.9 4.3 4.3  CL 112* 110 108  CO2 20* 22 25  GLUCOSE 86 120* 137*  BUN 18 21* 22*  CREATININE 0.85 0.93 0.87  CALCIUM 8.6* 8.7* 8.5*  GFRNONAA >60 >60 >60  GFRAA >60 >60 >60  ANIONGAP 6 5 5     Hematology Recent Labs  Lab 11/04/17 2037 11/05/17 0500 11/06/17 0445  WBC 4.9 5.4 4.0  RBC 4.00* 3.93* 3.75*  HGB 10.1* 9.7* 9.6*  HCT 31.8* 31.3* 30.3*  MCV 79.5 79.6 80.8  MCH 25.3* 24.7* 25.6*  MCHC 31.8 31.0 31.7  RDW 22.9* 22.9* 23.1*  PLT 154 139* 128*    Cardiac Enzymes Recent Labs  Lab 11/04/17 2342 11/05/17 0359 11/05/17 0500  TROPONINI <0.03 <0.03 <0.03    Recent Labs  Lab 11/04/17 2049  TROPIPOC 0.00     BNPNo results for input(s):   BNP, PROBNP in the last 168 hours.   DDimer No results for input(s): DDIMER in the last 168 hours.   Radiology    Dg Chest 2 View  Result Date: 11/04/2017 CLINICAL DATA:  Chest pain EXAM: CHEST  2 VIEW COMPARISON:  None. FINDINGS: Cardiomegaly. Median sternotomy wires appear intact and appropriately aligned. Lungs are clear. No pleural effusion or pneumothorax seen. No acute or suspicious osseous finding. Left-sided PICC line appears well positioned with tip at the level of the SVC/cavoatrial junction. IMPRESSION: No acute findings. Cardiomegaly. No evidence of pneumonia or pulmonary edema. Electronically Signed   By: Stan  Maynard M.D.   On: 11/04/2017 21:26   Ct Angio Chest/abd/pel For Dissection W And/or W/wo  Result Date: 11/05/2017 CLINICAL DATA:  56-year-old male with chest pain. EXAM: CT ANGIOGRAPHY CHEST, ABDOMEN AND PELVIS TECHNIQUE: Multidetector CT imaging through the  chest, abdomen and pelvis was performed using the standard protocol during bolus administration of intravenous contrast. Multiplanar reconstructed images and MIPs were obtained and reviewed to evaluate the vascular anatomy. CONTRAST:  100 cc Isovue 370 COMPARISON:  Chest radiograph dated 11/04/2017 FINDINGS: CTA CHEST FINDINGS Cardiovascular: There is mild cardiomegaly. There is coronary vascular calcification primarily involving the LAD and postsurgical changes of CABG. No pericardial effusion. Minimally dilated ascending aorta measures 4.3 cm in diameter. The thoracic aorta is otherwise unremarkable. No aneurysmal dilatation or evidence of dissection. Evaluation of the pulmonary arteries is limited due to respiratory motion artifact and suboptimal opacification of the peripheral branches. No large or central pulmonary artery embolus identified. Left-sided PICC with tip in the central SVC close to the cavoatrial junction. Mediastinum/Nodes: There is no hilar or mediastinal adenopathy. Esophagus is grossly unremarkable. No mediastinal fluid collection. Lungs/Pleura: Mild diffuse interstitial prominence and hazy and ground-glass airspace densities noted which may represent atelectatic changes or mild interstitial edema. Clinical correlation is recommended. There is no focal consolidation, pleural effusion, or pneumothorax. The central airways are patent. Musculoskeletal: Median sternotomy wires noted. No acute osseous pathology. Review of the MIP images confirms the above findings. CTA ABDOMEN AND PELVIS FINDINGS VASCULAR Aorta: Borderline ectasia infrarenal aorta measures 2 cm in diameter. There is no aneurysmal dilatation or evidence of dissection. Celiac: Patent without evidence of aneurysm, dissection, vasculitis or significant stenosis. SMA: Patent without evidence of aneurysm, dissection, vasculitis or significant stenosis. Renals: Minimal atherosclerotic calcification of the origin of the right renal artery.  The left renal artery is unremarkable. The renal arteries are patent. IMA: Patent without evidence of aneurysm, dissection, vasculitis or significant stenosis. Inflow: Right common iliac artery endovascular stent. The stent appears patent. There is coil embolization of the right internal iliac artery. The right external iliac artery and the left iliac arteries are patent. Veins: An infrarenal IVC filter noted.  No portal venous gas. Review of the MIP images confirms the above findings. NON-VASCULAR There is no intra-abdominal free air or free fluid. Hepatobiliary: There is a 3 cm cyst in the left lobe of the liver. The liver is otherwise unremarkable. No intrahepatic biliary ductal dilatation. The gallbladder is unremarkable. Pancreas: Unremarkable. No pancreatic ductal dilatation or surrounding inflammatory changes. Spleen: Normal in size without focal abnormality. Adrenals/Urinary Tract: The adrenal glands are unremarkable. There is a 5 cm left renal interpolar cyst. The kidneys are otherwise unremarkable. There is no hydronephrosis on either side. The visualized ureters and urinary bladder appear unremarkable. Stomach/Bowel: There are scattered sigmoid diverticula without active inflammatory changes. There is moderate stool throughout the colon. No bowel obstruction or active   inflammation. Normal appendix. Lymphatic: No adenopathy. Reproductive: The prostate and seminal vesicles are grossly unremarkable. Other: There is a broad-based supraumbilical ventral hernia measuring 6 cm in diameter. There is protrusion of the anterior wall of the distal stomach. Musculoskeletal: No acute or significant osseous findings. Review of the MIP images confirms the above findings. IMPRESSION: 1. No acute intrathoracic, abdominal, or pelvic pathology. No aortic aneurysm or dissection. No CT evidence of central pulmonary artery embolus. 2. Cardiomegaly with multi vessel coronary vascular calcification and postsurgical changes of  CABG. 3. Coil embolization of the right internal iliac artery. Right common iliac artery stent appears patent. Infrarenal IVC filter. 4. Sigmoid diverticulosis. No bowel obstruction or active inflammation. Normal appendix. Electronically Signed   By: Arash  Radparvar M.D.   On: 11/05/2017 06:47    Cardiac Studies   Echo pending  For cath today  Patient Profile     56 y.o. 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 Chest pain -Hx CAD, CABG and PCI. Recordds requested from [place]. Allergic to aspirin so on Brilinta. On statin, BB, ARB.  -Troponins negative  -Continues to have intermittent chest pressure over night -Given extensive cardiac history, CABG and post CABG stenting favor diagnostic cath, likely left FA approach. Pt reports multiple false positive nuclear studies in the past.  -Last documented dose of Lovenox was 2125, but pt states has not had since yesterday earlier in the day, yesterday.  -Pt agrees to cath today.   2. Hx of DVT's and PE's  - on warfarin typically, managed by [facility], but currently on Lovenox on coiling of right iliac artery aneurism last week. CTA negative for PE. He also has IVC filter. Vague history of occasional swelling in BLE, but no edema currently.   3. Thrombocytopenia  - Hemoglobin is 9.7, platelets 139--> Hgb 9.6, Plt 128 today. He is also on brilinta and lovenox, but reports no bleeding. Will monitor CBC.  4. Osteomyelitis of the jaw - continue IV unasyn per primary team. WBC 5.4->4.0. Afebrile.   5. HTN - SBP 110-160's. Suspect some of this is  related to pain/agitation. Continue Toprol xl 25 mg daily and Losartan 100 mg daily. May need to add an additional agent (or switch metop to coreg) if he remains hypertensive.   6. Seizures after TBI - continue antiepileptics per primary team. No seizure activity since 2006.  7. ? 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 Outside records requested per primary team.    For questions or updates, please contact [facility] Please consult [email or web address] for contact info under Cardiology/STEMI.      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  

## 2017-11-06 NOTE — Interval H&P Note (Signed)
History and Physical Interval Note:  11/06/2017 4:27 PM  Marvin Young  has presented today for cardiac cath with the diagnosis of unstable angina. The various methods of treatment have been discussed with the patient and family. After consideration of risks, benefits and other options for treatment, the patient has consented to  Procedure(s): LEFT HEART CATH AND CORS/GRAFTS ANGIOGRAPHY (N/A) as a surgical intervention .  The patient's history has been reviewed, patient examined, no change in status, stable for surgery.  I have reviewed the patient's chart and labs.  Questions were answered to the patient's satisfaction.    Cath Lab Visit (complete for each Cath Lab visit)  Clinical Evaluation Leading to the Procedure:   ACS: No.  Non-ACS:    Anginal Classification: CCS III  Anti-ischemic medical therapy: Minimal Therapy (1 class of medications)  Non-Invasive Test Results: No non-invasive testing performed  Prior CABG: Previous CABG         Verne Carrowhristopher Duncan Alejandro

## 2017-11-06 NOTE — Progress Notes (Signed)
Pt is continuing to unhook his PICC line from IV antibiotics and fluids and turning his pump off without letting the RN know. When talking with him about why he can not do that, he got agitated about the situation. Charge RN is aware of situation and patient behavior since he's been at hospital.  Rockne Coonsorcoran, Kelle Ruppert C, RN

## 2017-11-06 NOTE — Progress Notes (Addendum)
Pt in cath holding post cath. C/O pain in back, neck, arm and chest. Requests pain meds. Sleeping soundly within moments. Wakes for brief periods. Tolerating sheath removal pressure well. At 1740 Pt awakened during pressure hold and became agitated.  [facility]
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
## 2017-11-06 NOTE — Progress Notes (Addendum)
Site area: LFA Site Prior to Removal:  Level 0 Pressure Applied For:25 min Manual:   yes Patient Status During Pull:  Stable-sleeping Post Pull Site:  Level 0 Post Pull Instructions Given:  yes Post Pull Pulses Present: palpable Left PT Dressing Applied:  tegaderm Bedrest begins @ 1750 till 2050 Comments:removed by Lauro RegulusGeorg Zillich

## 2017-11-06 NOTE — Care Management Note (Signed)
Case Management Note  Patient Details  Name: Wyline CopasMicheal Lykens MRN: [id number] Date of Birth: 12-Mar-1961  Subjective/Objective: 57 y/o m admitted w/chest pain. From home. Cardio following-for heart cath. Noted issues.                    Action/Plan:d/c plan home.   Expected Discharge Date:  (unknown)               Expected Discharge Plan:  Home/Self Care  In-House Referral:     Discharge planning Services  CM Consult  Post Acute Care Choice:    Choice offered to:     DME Arranged:    DME Agency:     HH Arranged:    HH Agency:     Status of Service:  In process, will continue to follow  If discussed at Long Length of Stay Meetings, dates discussed:    Additional Comments:  Lanier ClamMahabir, Tahisha Hakim, RN 11/06/2017, 11:02 AM

## 2017-11-06 NOTE — Progress Notes (Signed)
PROGRESS NOTE    Marvin CopasMicheal Young  ZOX:[id number] DOB: 18-Dec-1960 DOA: 11/04/2017 PCP: System, Pcp Not In  Brief Narrative: 57 y.o.malewith medical history significant ofCADs/p PCI and CABG,MI,DVT/PE,on chronic anticoagulation, and seizure disorderfollowing TBI;who presents with complaints of left-sided chest pain since 57 around 3-4 PM this afternoon. Pain is described as sharp and radiates to his back, left arm, and jaw. Reported associated symptoms of nausea, vomiting, diaphoresis, shortness of breath, and lightheadedness. He denies having any loss of consciousness. Tried taking 4 nitroglycerin tablets without any relief of symptoms. Rated pain as a 8 out of 10 at its worst point.   [place] reports receiving most of his care at [facility] [place]. He is currently on 6 weeks of Unasyn for treatment of a jawinfectionafter having several of his teeth removed.Lastly notes being status post iliac artery aneurysm repair sometime last week. He has been taking subcutaneous Lovenox as advised and the plan was to bridge him back to Coumadin next week.He admits to continuing to smoke but reports being down to only 3 cigarettes per day.  ED Course:Admission into the emergency department patient was seen to be afebrile with blood pressure 126/96-160 7/87, and all other vital signs relatively within normal limits. Labs revealed WBC 4.9, hemoglobin 10.1, andtroponin 0.[facility] but not obtained yet. Patient reported having anaphylaxis allergy to aspirin and therefore was not given    11/06/2017 -patient has been refusing all medical treatments and has taken off his telemetry monitor and goes outside the hospital to smoke.  He was asked to leave AMA but he would not sign his AMA.  [facility] by cardiology for a cardiac cath Under their service.  Assessment & Plan:     Principal Problem:   Chest pain Active Problems:   Odontogenic infection of jaw   Seizure disorder [facility](HCC)   Iliac artery aneurysm (HCC)   CAD (coronary artery disease)   Chest pain/history of CAD CABG 2014 multiple stents in 2017 on Brilinta-troponin negative since admission.  CT angiogram of the chest and abdomen shows no dissection or PE.  Patient followed by cardiology for possible cardiac cath today. 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  Osteomyelitis of the jaw-  On Unasyn for a total of 6 weeks.  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     DVT prophylaxis lovenox Code Status:full Family Communication: none Disposition Plan: Hopefully he can be discharged home after cath Consultants:   cards Procedures: Antimicrobials:none  Subjective:no new symptoms..  Objective: Vitals:   11/05/17 2125 11/05/17 2357 11/06/17 0442 11/06/17 1036  BP: (!) 127/57 (!) 152/65 (!) 151/52 (!) 159/98  Pulse: 81 84 78 89  Resp: 16  16 18   Temp: 98 F (36.7 C) 98.3 F (36.8 C) 98.2 F (36.8 C) 99 F (37.2 C)  TempSrc: Oral Oral Oral Oral  SpO2: 99% 98% 98% 99%  Weight:      Height:        Intake/Output Summary (Last 24 hours) at 11/06/2017 1201 Last data filed at 11/06/2017 0600 Gross per 24 hour  Intake 170 ml  Output 0 ml  Net 170 ml   Filed Weights   11/04/17 2224 11/05/17 1451  Weight: 83 kg (183 lb) 83.9 kg (  185 lb)    Examination:  General exam: Appears calm and comfortable  Respiratory system: Clear to auscultation. Respiratory effort normal. Cardiovascular system: S1 & S2 heard, RRR. No JVD, murmurs, rubs, gallops or clicks. No pedal edema. Gastrointestinal system: Abdomen is nondistended, soft and nontender. No organomegaly or masses felt. Normal bowel sounds  heard. Central nervous system: Alert and oriented. No focal neurological deficits. Extremities: Symmetric 5 x 5 power. Skin: No rashes, lesions or ulcers Psychiatry: Judgement and insight appear normal. Mood & affect appropriate.     Data Reviewed: I have personally reviewed following labs and imaging studies  CBC: Recent Labs  Lab 11/04/17 2037 11/05/17 0500 11/06/17 0445  WBC 4.9 5.4 4.0  NEUTROABS  --  4.4  --   HGB 10.1* 9.7* 9.6*  HCT 31.8* 31.3* 30.3*  MCV 79.5 79.6 80.8  PLT 154 139* 128*   Basic Metabolic Panel: Recent Labs  Lab 11/04/17 2037 11/05/17 0500 11/06/17 0445  NA 138 137 138  K 3.9 4.3 4.3  CL 112* 110 108  CO2 20* 22 25  GLUCOSE 86 120* 137*  BUN 18 21* 22*  CREATININE 0.85 0.93 0.87  CALCIUM 8.6* 8.7* 8.5*   GFR: Estimated Creatinine Clearance: 102.4 mL/min (by C-G formula based on SCr of 0.87 mg/dL). Liver Function Tests: No results for input(s): AST, ALT, ALKPHOS, BILITOT, PROT, ALBUMIN in the last 168 hours. No results for input(s): LIPASE, AMYLASE in the last 168 hours. No results for input(s): AMMONIA in the last 168 hours. Coagulation Profile: Recent Labs  Lab 11/06/17 0445  INR 0.95   Cardiac Enzymes: Recent Labs  Lab 11/04/17 2342 11/05/17 0359 11/05/17 0500  TROPONINI <0.03 <0.03 <0.03   BNP (last 3 results) No results for input(s): PROBNP in the last 8760 hours. HbA1C: No results for input(s): HGBA1C in the last 72 hours. CBG: No results for input(s): GLUCAP in the last 168 hours. Lipid Profile: Recent Labs    11/05/17 0359  CHOL 139  HDL 53  LDLCALC 54  TRIG 158*  CHOLHDL 2.6   Thyroid Function Tests: No results for input(s): TSH, T4TOTAL, FREET4, T3FREE, THYROIDAB in the last 72 hours. Anemia Panel: No results for input(s): VITAMINB12, FOLATE, FERRITIN, TIBC, IRON, RETICCTPCT in the last 72 hours. Sepsis Labs: No results for input(s): PROCALCITON, LATICACIDVEN in the last 168 hours.  No results found for  this or any previous visit (from the past 240 hour(s)).       Radiology Studies: Dg Chest 2 View  Result Date: 11/04/2017 CLINICAL DATA:  Chest pain EXAM: CHEST  2 VIEW COMPARISON:  None. FINDINGS: Cardiomegaly. Median sternotomy wires appear intact and appropriately aligned. Lungs are clear. No pleural effusion or pneumothorax seen. No acute or suspicious osseous finding. Left-sided PICC line appears well positioned with tip at the level of the SVC/cavoatrial junction. IMPRESSION: No acute findings. Cardiomegaly. No evidence of pneumonia or pulmonary edema. Electronically Signed   By: Bary Richard M.D.   On: 11/04/2017 21:26   Ct Angio Chest/abd/pel For Dissection W And/or W/wo  Result Date: 11/05/2017 CLINICAL DATA:  57 year old male with chest pain. EXAM: CT ANGIOGRAPHY CHEST, ABDOMEN AND PELVIS TECHNIQUE: Multidetector CT imaging through the chest, abdomen and pelvis was performed using the standard protocol during bolus administration of intravenous contrast. Multiplanar reconstructed images and MIPs were obtained and reviewed to evaluate the vascular anatomy. CONTRAST:  100 cc Isovue 370 COMPARISON:  Chest radiograph dated 11/04/2017 FINDINGS: CTA CHEST FINDINGS Cardiovascular: There is mild cardiomegaly. There  is coronary vascular calcification primarily involving the LAD and postsurgical changes of CABG. No pericardial effusion. Minimally dilated ascending aorta measures 4.3 cm in diameter. The thoracic aorta is otherwise unremarkable. No aneurysmal dilatation or evidence of dissection. Evaluation of the pulmonary arteries is limited due to respiratory motion artifact and suboptimal opacification of the peripheral branches. No large or central pulmonary artery embolus identified. Left-sided PICC with tip in the central SVC close to the cavoatrial junction. Mediastinum/Nodes: There is no hilar or mediastinal adenopathy. Esophagus is grossly unremarkable. No mediastinal fluid collection.  Lungs/Pleura: Mild diffuse interstitial prominence and hazy and ground-glass airspace densities noted which may represent atelectatic changes or mild interstitial edema. Clinical correlation is recommended. There is no focal consolidation, pleural effusion, or pneumothorax. The central airways are patent. Musculoskeletal: Median sternotomy wires noted. No acute osseous pathology. Review of the MIP images confirms the above findings. CTA ABDOMEN AND PELVIS FINDINGS VASCULAR Aorta: Borderline ectasia infrarenal aorta measures 2 cm in diameter. There is no aneurysmal dilatation or evidence of dissection. Celiac: Patent without evidence of aneurysm, dissection, vasculitis or significant stenosis. SMA: Patent without evidence of aneurysm, dissection, vasculitis or significant stenosis. Renals: Minimal atherosclerotic calcification of the origin of the right renal artery. The left renal artery is unremarkable. The renal arteries are patent. IMA: Patent without evidence of aneurysm, dissection, vasculitis or significant stenosis. Inflow: Right common iliac artery endovascular stent. The stent appears patent. There is coil embolization of the right internal iliac artery. The right external iliac artery and the left iliac arteries are patent. Veins: An infrarenal IVC filter noted.  No portal venous gas. Review of the MIP images confirms the above findings. NON-VASCULAR There is no intra-abdominal free air or free fluid. Hepatobiliary: There is a 3 cm cyst in the left lobe of the liver. The liver is otherwise unremarkable. No intrahepatic biliary ductal dilatation. The gallbladder is unremarkable. Pancreas: Unremarkable. No pancreatic ductal dilatation or surrounding inflammatory changes. Spleen: Normal in size without focal abnormality. Adrenals/Urinary Tract: The adrenal glands are unremarkable. There is a 5 cm left renal interpolar cyst. The kidneys are otherwise unremarkable. There is no hydronephrosis on either side. The  visualized ureters and urinary bladder appear unremarkable. Stomach/Bowel: There are scattered sigmoid diverticula without active inflammatory changes. There is moderate stool throughout the colon. No bowel obstruction or active inflammation. Normal appendix. Lymphatic: No adenopathy. Reproductive: The prostate and seminal vesicles are grossly unremarkable. Other: There is a broad-based supraumbilical ventral hernia measuring 6 cm in diameter. There is protrusion of the anterior wall of the distal stomach. Musculoskeletal: No acute or significant osseous findings. Review of the MIP images confirms the above findings. IMPRESSION: 1. No acute intrathoracic, abdominal, or pelvic pathology. No aortic aneurysm or dissection. No CT evidence of central pulmonary artery embolus. 2. Cardiomegaly with multi vessel coronary vascular calcification and postsurgical changes of CABG. 3. Coil embolization of the right internal iliac artery. Right common iliac artery stent appears patent. Infrarenal IVC filter. 4. Sigmoid diverticulosis. No bowel obstruction or active inflammation. Normal appendix. Electronically Signed   By: Elgie Collard M.D.   On: 11/05/2017 06:47        Scheduled Meds: . atorvastatin  20 mg Oral Daily  . diphenhydrAMINE  50 mg Oral Once   Or  . diphenhydrAMINE  50 mg Intravenous Once  . lamoTRIgine  100 mg Oral BID  . levETIRAcetam  1,000 mg Oral BID  . losartan  100 mg Oral Daily  . metoprolol succinate  25 mg  Oral Daily  . predniSONE  50 mg Oral Q6H  . sertraline  100 mg Oral Daily  . sodium chloride flush  3 mL Intravenous Q12H  . ticagrelor  90 mg Oral BID   Continuous Infusions: . sodium chloride    . sodium chloride    . ampicillin-sulbactam (UNASYN) 1.5 g IVPB Stopped (11/06/17 1137)     LOS: 1 day      Alwyn Ren, MD [facility]  If 7PM-7AM, please contact night-coverage [email or web address] Password [email or web address] 11/06/2017, 12:01 PM

## 2017-11-06 NOTE — Progress Notes (Signed)
Pt continued to disconnect self from PICC line throughout the whole shift. Education provided to pt about the dangers of getting infection into his bloodstream by doing this. Pt noncompliant all night and rude towards staff. Will continue to monitor.

## 2017-11-06 NOTE — Progress Notes (Signed)
  Echocardiogram 2D Echocardiogram has been performed.  Marvin Young T Marvin Young 11/06/2017, 2:06 PM

## 2017-11-06 NOTE — Progress Notes (Signed)
Received pt from [facility] via [facility] alert and oriented X 4, denies any CP at this time.  Consent signed.  Security called to escort pt outside for a cigarette.  Pt back to room, in gown and IV team consult for PICC line access.

## 2017-11-06 NOTE — Progress Notes (Signed)
Progress Note  Patient Name: Marvin Young Date of Encounter: 11/06/2017  Primary Cardiologist: No primary care provider on file.   Subjective   Pt lying flat in bed without any difficulty. He admits to continued intermittent chest pressure over night, not related to activity, lasting from 5 minutes to 40 minutes. Has been difficult with staff. Currently he is calm and agrees to proceed with cath.   Inpatient Medications    Scheduled Meds: . atorvastatin  20 mg Oral Daily  . diphenhydrAMINE  50 mg Oral Once   Or  . diphenhydrAMINE  50 mg Intravenous Once  . lamoTRIgine  100 mg Oral BID  . levETIRAcetam  1,000 mg Oral BID  . losartan  100 mg Oral Daily  . metoprolol succinate  25 mg Oral Daily  . predniSONE  50 mg Oral Q6H  . sertraline  100 mg Oral Daily  . sodium chloride flush  3 mL Intravenous Q12H  . ticagrelor  90 mg Oral BID   Continuous Infusions: . sodium chloride    . sodium chloride    . ampicillin-sulbactam (UNASYN) 1.5 g IVPB Stopped (11/06/17 0539)   PRN Meds: sodium chloride, acetaminophen, diphenhydrAMINE, gi cocktail, morphine injection, nitroGLYCERIN, ondansetron (ZOFRAN) IV, sodium chloride flush, sodium chloride flush, temazepam   Vital Signs    Vitals:   11/05/17 1451 11/05/17 2125 11/05/17 2357 11/06/17 0442  BP: (!) 163/101 (!) 127/57 (!) 152/65 (!) 151/52  Pulse: 99 81 84 78  Resp: 14 16  16   Temp: 98.1 F (36.7 C) 98 F (36.7 C) 98.3 F (36.8 C) 98.2 F (36.8 C)  TempSrc: Oral Oral Oral Oral  SpO2: 100% 99% 98% 98%  Weight: 185 lb (83.9 kg)     Height: 5\' 8"  (1.727 m)       Intake/Output Summary (Last 24 hours) at 11/06/2017 0802 Last data filed at 11/06/2017 0600 Gross per 24 hour  Intake 220 ml  Output 0 ml  Net 220 ml   Filed Weights   11/04/17 2224 11/05/17 1451  Weight: 183 lb (83 kg) 185 lb (83.9 kg)    Telemetry    SR 60's-70's - Personally Reviewed  ECG    No new tracing - Personally Reviewed  Physical Exam    GEN: No acute distress.   Neck: No JVD Cardiac: RRR, no murmurs, rubs, or gallops.  Respiratory: Clear to auscultation bilaterally. GI: Soft, nontender, non-distended  MS: No edema; No deformity. Neuro:  Nonfocal  Psych: Normal affect   Labs    Chemistry Recent Labs  Lab 11/04/17 2037 11/05/17 0500 11/06/17 0445  NA 138 137 138  K 3.9 4.3 4.3  CL 112* 110 108  CO2 20* 22 25  GLUCOSE 86 120* 137*  BUN 18 21* 22*  CREATININE 0.85 0.93 0.87  CALCIUM 8.6* 8.7* 8.5*  GFRNONAA >60 >60 >60  GFRAA >60 >60 >60  ANIONGAP 6 5 5      Hematology Recent Labs  Lab 11/04/17 2037 11/05/17 0500 11/06/17 0445  WBC 4.9 5.4 4.0  RBC 4.00* 3.93* 3.75*  HGB 10.1* 9.7* 9.6*  HCT 31.8* 31.3* 30.3*  MCV 79.5 79.6 80.8  MCH 25.3* 24.7* 25.6*  MCHC 31.8 31.0 31.7  RDW 22.9* 22.9* 23.1*  PLT 154 139* 128*    Cardiac Enzymes Recent Labs  Lab 11/04/17 2342 11/05/17 0359 11/05/17 0500  TROPONINI <0.03 <0.03 <0.03    Recent Labs  Lab 11/04/17 2049  TROPIPOC 0.00     BNPNo results for input(s):  BNP, PROBNP in the last 168 hours.   DDimer No results for input(s): DDIMER in the last 168 hours.   Radiology    Dg Chest 2 View  Result Date: 11/04/2017 CLINICAL DATA:  Chest pain EXAM: CHEST  2 VIEW COMPARISON:  None. FINDINGS: Cardiomegaly. Median sternotomy wires appear intact and appropriately aligned. Lungs are clear. No pleural effusion or pneumothorax seen. No acute or suspicious osseous finding. Left-sided PICC line appears well positioned with tip at the level of the SVC/cavoatrial junction. IMPRESSION: No acute findings. Cardiomegaly. No evidence of pneumonia or pulmonary edema. Electronically Signed   By: Bary Richard M.D.   On: 11/04/2017 21:26   Ct Angio Chest/abd/pel For Dissection W And/or W/wo  Result Date: 11/05/2017 CLINICAL DATA:  57 year old male with chest pain. EXAM: CT ANGIOGRAPHY CHEST, ABDOMEN AND PELVIS TECHNIQUE: Multidetector CT imaging through the  chest, abdomen and pelvis was performed using the standard protocol during bolus administration of intravenous contrast. Multiplanar reconstructed images and MIPs were obtained and reviewed to evaluate the vascular anatomy. CONTRAST:  100 cc Isovue 370 COMPARISON:  Chest radiograph dated 11/04/2017 FINDINGS: CTA CHEST FINDINGS Cardiovascular: There is mild cardiomegaly. There is coronary vascular calcification primarily involving the LAD and postsurgical changes of CABG. No pericardial effusion. Minimally dilated ascending aorta measures 4.3 cm in diameter. The thoracic aorta is otherwise unremarkable. No aneurysmal dilatation or evidence of dissection. Evaluation of the pulmonary arteries is limited due to respiratory motion artifact and suboptimal opacification of the peripheral branches. No large or central pulmonary artery embolus identified. Left-sided PICC with tip in the central SVC close to the cavoatrial junction. Mediastinum/Nodes: There is no hilar or mediastinal adenopathy. Esophagus is grossly unremarkable. No mediastinal fluid collection. Lungs/Pleura: Mild diffuse interstitial prominence and hazy and ground-glass airspace densities noted which may represent atelectatic changes or mild interstitial edema. Clinical correlation is recommended. There is no focal consolidation, pleural effusion, or pneumothorax. The central airways are patent. Musculoskeletal: Median sternotomy wires noted. No acute osseous pathology. Review of the MIP images confirms the above findings. CTA ABDOMEN AND PELVIS FINDINGS VASCULAR Aorta: Borderline ectasia infrarenal aorta measures 2 cm in diameter. There is no aneurysmal dilatation or evidence of dissection. Celiac: Patent without evidence of aneurysm, dissection, vasculitis or significant stenosis. SMA: Patent without evidence of aneurysm, dissection, vasculitis or significant stenosis. Renals: Minimal atherosclerotic calcification of the origin of the right renal artery.  The left renal artery is unremarkable. The renal arteries are patent. IMA: Patent without evidence of aneurysm, dissection, vasculitis or significant stenosis. Inflow: Right common iliac artery endovascular stent. The stent appears patent. There is coil embolization of the right internal iliac artery. The right external iliac artery and the left iliac arteries are patent. Veins: An infrarenal IVC filter noted.  No portal venous gas. Review of the MIP images confirms the above findings. NON-VASCULAR There is no intra-abdominal free air or free fluid. Hepatobiliary: There is a 3 cm cyst in the left lobe of the liver. The liver is otherwise unremarkable. No intrahepatic biliary ductal dilatation. The gallbladder is unremarkable. Pancreas: Unremarkable. No pancreatic ductal dilatation or surrounding inflammatory changes. Spleen: Normal in size without focal abnormality. Adrenals/Urinary Tract: The adrenal glands are unremarkable. There is a 5 cm left renal interpolar cyst. The kidneys are otherwise unremarkable. There is no hydronephrosis on either side. The visualized ureters and urinary bladder appear unremarkable. Stomach/Bowel: There are scattered sigmoid diverticula without active inflammatory changes. There is moderate stool throughout the colon. No bowel obstruction or active  inflammation. Normal appendix. Lymphatic: No adenopathy. Reproductive: The prostate and seminal vesicles are grossly unremarkable. Other: There is a broad-based supraumbilical ventral hernia measuring 6 cm in diameter. There is protrusion of the anterior wall of the distal stomach. Musculoskeletal: No acute or significant osseous findings. Review of the MIP images confirms the above findings. IMPRESSION: 1. No acute intrathoracic, abdominal, or pelvic pathology. No aortic aneurysm or dissection. No CT evidence of central pulmonary artery embolus. 2. Cardiomegaly with multi vessel coronary vascular calcification and postsurgical changes of  CABG. 3. Coil embolization of the right internal iliac artery. Right common iliac artery stent appears patent. Infrarenal IVC filter. 4. Sigmoid diverticulosis. No bowel obstruction or active inflammation. Normal appendix. Electronically Signed   By: Elgie Collard M.D.   On: 11/05/2017 06:47    Cardiac Studies   Echo pending  For cath today  Patient Profile     57 y.o. 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 Chest pain -Hx CAD, CABG and PCI. Recordds requested from [place]. Allergic to aspirin so on Brilinta. On statin, BB, ARB.  -Troponins negative  -Continues to have intermittent chest pressure over night -Given extensive cardiac history, CABG and post CABG stenting favor diagnostic cath, likely left FA approach. Pt reports multiple false positive nuclear studies in the past.  -Last documented dose of Lovenox was 2125, but pt states has not had since yesterday earlier in the day, yesterday.  -Pt agrees to cath today.   2. Hx of DVT's and PE's  - on warfarin typically, managed by [facility], but currently on Lovenox on coiling of right iliac artery aneurism last week. CTA negative for PE. He also has IVC filter. Vague history of occasional swelling in BLE, but no edema currently.   3. Thrombocytopenia  - Hemoglobin is 9.7, platelets 139--> Hgb 9.6, Plt 128 today. He is also on brilinta and lovenox, but reports no bleeding. Will monitor CBC.  4. Osteomyelitis of the jaw - continue IV unasyn per primary team. WBC 5.4->4.0. Afebrile.   5. HTN - SBP 110-160's. Suspect some of this is  related to pain/agitation. Continue Toprol xl 25 mg daily and Losartan 100 mg daily. May need to add an additional agent (or switch metop to coreg) if he remains hypertensive.   6. Seizures after TBI - continue antiepileptics per primary team. No seizure activity since 2006.  7. ? 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 Outside records requested per primary team.    For questions or updates, please contact [facility] Please consult [email or web address] for contact info under Cardiology/STEMI.      Signed, Berton Bon, NP  11/06/2017, 8:02 AM    Patient examined chart reviewed He is a traveling nurse and manipulative has significant CAD with previous CABG And stent procedures He indicates previous false negative myuovue's  For cath today likely from left femoral artery Has had right iliac artery coiling   Marvin Young

## 2017-11-06 NOTE — Progress Notes (Signed)
Patient is non compliant with fall precaution measures. He doesn't want bed alarm on, wanders and leaves the floor anytime he pleases.We have received calls from other units he wanders to seemingly lost. He had to be escorted from [facility] to this unit because he was lost.

## 2017-11-06 NOTE — Progress Notes (Signed)
Patient continues to leave his room and department even after bedside RN and CN have informed patient of MDs orders for bedrest with bathroom privileges.  Patient has become increasingly more agitated by the minute stating that the staff is harassing him even after RNs have informed him that we are following doctor's orders and want to keep him safe.  Patient is scheduled for cardiac cath today ~1330.  While off the unit towards 4W, Clinical research associatewriter asked patient if we could return to his room, patient requested to see orders for bedrest with bathroom privileges.  I informed patient that I would show him the order from the computer in his room where he proceeded to use profanity and told me to "back the fuck up" even though RN was at least 5 feet away from patient.  Patient then proceeded to charge towards patient where I stated "Please don't charge at me sir" and patient stated with his cane pointing towards me "Oh I will.  Let's go to the pet store and get you a collar and leash, you dog!"  [facility]. [facility] returned to room while the [facility] assisted [facility] in discussing what the patient wanted to do.  Patient stated he wanted to have his cardiac cath done today. Patient began filming us at the bedside and taking pictures even though we repeatedly told him that he did not have our permission to take pictures or video us.  He asked for us to leave and we did.  Patient continues to leave 4E even after multiple attempts of reminding him to please stay on 4E.  Security was called as well for assistance during this time. Patient came up to the desk and removed his heart monitor.  RN informed patient that he needed to have his heart monitor on so we could monitor him prior to his procedure but patient refusing.  Cardiologist at the nurses station during this encounter and made aware of patient's refusal.  Patient informed writer "I'm not talking to you, lets go get that collar and leash for you." Patient  continues to leave the unit even with heart monitor off at this time.  MD has been updated on what has occurred this morning with this patient. Will continue to monitor closely.

## 2017-11-06 NOTE — Progress Notes (Signed)
Pt was laying in bed asleep and RN went into patient's room to ask him a question, RN noticed that the patient had welch's fruit snacks in his bed with him that he had brought from home. The bag was unopened and the RN reminded the patient that he wasn't allowed to eat/drink before his cardiac cath today. He responded that he knew and that he hadn't and he wished that we would stop hounding him on that. RN said she was just trying to follow the orders. Will continue to monitor patient until [facility] comes to get patient for cath.  Rockne Coonsorcoran, Phelix Fudala C, RN

## 2017-11-06 NOTE — Progress Notes (Signed)
1800 patient transferred from cath lab via bed to unit. Admitted to 6C03. Patient resting quietly in bed, assessed. Patient stated he had to go to the bathroom. Reminded that he had agreed to bedrest after procedure with Dr. Clifton JamesMcAlhany. Attempting to get out of bed and yelling at staff "don't touch me, that's assault, I have my rights." Patient reminded he had a clot on his artery where the hole is and it can pop off with movement and he can bleed from the artery. Patient stated he needed to use the urinal and yelled, "everybody get out, I know my rights.", stated, "You pervert." I replied, "I am not a pervert, I am just concerned about you bleeding." Left the room and Marvin Young remained with patient while patient used the urinal. Marvin Young gave patient menu and told him to look at it and choose his dinner. When reentering the room, patient was reading menu and discussed what he wanted to eat for dinner. Resting quietly and dosing in and out at 1830. 1900 checked patient, sleeping, site dry and intact.

## 2017-11-07 ENCOUNTER — Encounter (HOSPITAL_COMMUNITY): Payer: Self-pay | Admitting: Cardiovascular Disease

## 2017-11-07 DIAGNOSIS — R042 Hemoptysis: Secondary | ICD-10-CM

## 2017-11-07 LAB — OCCULT BLOOD GASTRIC / DUODENUM (SPECIMEN CUP): Occult Blood, Gastric: POSITIVE — AB

## 2017-11-07 MED ORDER — ENOXAPARIN SODIUM 80 MG/0.8ML ~~LOC~~ SOLN
80.0000 mg | Freq: Two times a day (BID) | SUBCUTANEOUS | Status: DC
  Filled 2017-11-07: qty 0.8

## 2017-11-07 MED ORDER — PANTOPRAZOLE SODIUM 40 MG PO TBEC: 40.0000 mg | DELAYED_RELEASE_TABLET | Freq: Every day | ORAL | Status: DC

## 2017-11-07 MED ORDER — PANTOPRAZOLE SODIUM 40 MG IV SOLR
40.0000 mg | Freq: Two times a day (BID) | INTRAVENOUS | Status: DC
  Administered 2017-11-07 – 2017-11-09 (×6): 40 mg via INTRAVENOUS
  Filled 2017-11-07 (×7): qty 40

## 2017-11-07 MED ORDER — PANTOPRAZOLE SODIUM 40 MG PO TBEC: 40.0000 mg | DELAYED_RELEASE_TABLET | Freq: Two times a day (BID) | ORAL | Status: DC

## 2017-11-07 NOTE — Care Management Note (Addendum)
Case Management Note  Patient Details  Name: Marvin Young MRN: [id number] Date of Birth: 1960-12-24  Subjective/Objective:  [facility], patient states he is a [place], [place] [facility] [facility] [place] ,they do not have patient listed.  He was previously on brilinta before coming to the hospital.  Also financial counseling came up to see him , they could not verify any insurance.  NCM will assist patient with Match Letter for Medication assist.  Patient is stating that [facility] and he has been doing this at home himself. He states his Case Manager name is Genevie CheshireBilly but he does not know the last name.  NCM asked who prescribed the iv abx for him, he states Dr. Franklyn LorUnyis at the ID clinic in [place].  He could not give NCM a full name.  [place] in [place] and states he goes to the [facility].  NCM called to make apt, they state he has not been there before, apt made for new patient apt 3/7 at 3:30, put on AVS. NCM gave patient Match Letter for med ast.  Patient states he is on Unasyn 1.5 for three weeks, NCM can not verify this information, will inform MD.               Action/Plan: NCM will follow for dc needs.   Expected Discharge Date:  (unknown)               Expected Discharge Plan:  Home/Self Care  In-House Referral:     Discharge planning Services  CM Consult  Post Acute Care Choice:    Choice offered to:     DME Arranged:    DME Agency:     HH Arranged:    HH Agency:     Status of Service:  In process, will continue to follow  If discussed at Long Length of Stay Meetings, dates discussed:    Additional Comments:  Leone Havenaylor, Adjoa Althouse Clinton, RN 11/07/2017, 9:48 AM

## 2017-11-07 NOTE — Progress Notes (Signed)
Patient left room to go to the vending machine. Telemetry monitoring was lost for about 10 minutes whiles patient was away. Upon his return he endorsed that it was very cold outside. Patient counseled not to go outside again.

## 2017-11-07 NOTE — Progress Notes (Signed)
Blood spots noted on the patient's bed sheet. According to pt he has been spitting blood.

## 2017-11-07 NOTE — Progress Notes (Signed)
Patient reported vomiting blood this am. Physician into see patient and instructed patient to notify nurse if vomiting blood again so it can be observed. Care management reported a cup by bedside with blood in it. Patient sleeping and noted cup at bedside with approx 4 ounces of bloody fluid. Notified Dr. Ronalee BeltsBhandari by text page. Noted orders changed to give IV protonix. Patient had no further episodes of vomiting.

## 2017-11-07 NOTE — Progress Notes (Signed)
Progress Note  Patient Name: Marvin Young Date of Encounter: 11/07/2017  Primary Cardiologist: No primary care provider on file.   Subjective   No pain continues to be manipulative and leave floor likely to smoke No chest pain eating breakfast   Inpatient Medications    Scheduled Meds: . atorvastatin  20 mg Oral Daily  . lamoTRIgine  100 mg Oral BID  . levETIRAcetam  1,000 mg Oral BID  . losartan  100 mg Oral Daily  . metoprolol succinate  25 mg Oral Daily  . sertraline  100 mg Oral Daily  . sodium chloride flush  3 mL Intravenous Q12H  . ticagrelor  90 mg Oral BID   Continuous Infusions: . sodium chloride    . ampicillin-sulbactam (UNASYN) 1.5 g IVPB 1.5 g (11/07/17 0713)   PRN Meds: sodium chloride, acetaminophen, diphenhydrAMINE, gi cocktail, LORazepam, morphine injection, nitroGLYCERIN, ondansetron (ZOFRAN) IV, sodium chloride flush, sodium chloride flush, temazepam   Vital Signs    Vitals:   11/07/17 0049 11/07/17 0148 11/07/17 0650 11/07/17 0716  BP:    (!) 147/78  Pulse:    88  Resp: 20 16 20  (!) 22  Temp:    (!) 97.5 F (36.4 C)  TempSrc:    Oral  SpO2:    98%  Weight:      Height:        Intake/Output Summary (Last 24 hours) at 11/07/2017 0812 Last data filed at 11/06/2017 2200 Gross per 24 hour  Intake 632.5 ml  Output 500 ml  Net 132.5 ml   Filed Weights   11/04/17 2224 11/05/17 1451  Weight: 183 lb (83 kg) 185 lb (83.9 kg)    Telemetry    SR 60's-70's - Personally Reviewed  ECG    No new tracing - Personally Reviewed  Physical Exam   GEN: No acute distress.   Neck: No JVD Cardiac: RRR, no murmurs, rubs, or gallops.  Respiratory: Clear to auscultation bilaterally. GI: Soft, nontender, non-distended  MS: No edema; No deformity. Neuro:  Nonfocal  Psych: Normal affect  Left femoral artery A no hematoma   Labs    Chemistry Recent Labs  Lab 11/04/17 2037 11/05/17 0500 11/06/17 0445  NA 138 137 138  K 3.9 4.3 4.3  CL  112* 110 108  CO2 20* 22 25  GLUCOSE 86 120* 137*  BUN 18 21* 22*  CREATININE 0.85 0.93 0.87  CALCIUM 8.6* 8.7* 8.5*  GFRNONAA >60 >60 >60  GFRAA >60 >60 >60  ANIONGAP 6 5 5      Hematology Recent Labs  Lab 11/04/17 2037 11/05/17 0500 11/06/17 0445  WBC 4.9 5.4 4.0  RBC 4.00* 3.93* 3.75*  HGB 10.1* 9.7* 9.6*  HCT 31.8* 31.3* 30.3*  MCV 79.5 79.6 80.8  MCH 25.3* 24.7* 25.6*  MCHC 31.8 31.0 31.7  RDW 22.9* 22.9* 23.1*  PLT 154 139* 128*    Cardiac Enzymes Recent Labs  Lab 11/04/17 2342 11/05/17 0359 11/05/17 0500  TROPONINI <0.03 <0.03 <0.03    Recent Labs  Lab 11/04/17 2049  TROPIPOC 0.00     BNPNo results for input(s): BNP, PROBNP in the last 168 hours.   DDimer No results for input(s): DDIMER in the last 168 hours.   Radiology    No results found.  Cardiac Studies   Echo pending  For cath today  Patient Profile     57 y.o. 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 Chest pain -R/O no acute ECG changes cath yesterday with good revascularization patent stents in LAD/Circumflex no disease in  RCA and patent SVG to [facility]   2. Hx of DVT's and PE's  - on warfarin typically, [facility], but currently on Lovenox on coiling of right iliac artery aneurism last week. CTA negative for PE. He also has IVC filter. Vague history of occasional swelling in BLE, but no edema currently.   3. Thrombocytopenia  - stable numbers post cath  4. Osteomyelitis of the jaw - continue IV unasyn per primary team. WBC 5.4->4.0. Afebrile.   5. HTN - continue  beta blocker and ARB   6. Seizures after TBI - continue antiepileptics per primary team. No seizure activity since 2006.  7. ? 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 Outside records requested per primary team.    D[facility]C [facility] with [facility] He will not be [facility] by [facility] due to his behavioral issues  Charlton Hawseter Emmeline Winebarger

## 2017-11-07 NOTE — Progress Notes (Signed)
Pt left the room. Patient stated that he will go to the lobby.

## 2017-11-07 NOTE — Progress Notes (Signed)
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  Patient was transferred to [facility] for cardiac cath.  He underwent cardiac cath with good revascularization and patent stents in LAD and circumflex.  Patient has been refusing many cares in the hospital and not cooperative with the treatment.  Assessment & Plan:  #Chest pain rule out CAD: Status post cardiac cath with good revascularization and patent stents in LAD circumflex with no disease in RCA and patent SVG to OM.  Currently on medical management.  Denies chest pain today.  Cardiology consult appreciated.  #Hemoptysis: Patient reported frank red vomiting of the blood today.  Nurse reported saw it at the bedside.  I am holding Lovenox subcutaneous today.  I started Protonix.  Repeat CBC in the morning.  On Brilinta.  #Osteomyelitis of the as per patient and from the prior note: Currently on IV Unasyn.  Case manager was consulted.  Patient reported that he gets his [facility] at [facility].   History of hypertension: Continue losartan, metoprolol.  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  On Keppra, Lamictal.  Seizure precaution.  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  Noncompliance and not cooperating with medical treatment: Patient was educated.  Continue supportive care.  Anxiety depression: Continue Zoloft.  DVT prophylaxis: holding lovenox now.  SCD. Code Status:  Full code Family Communication: No family at bedside Disposition Plan: Giving patient today because of hemoptysis and need to decide about anticoagulation.    Consultants:   Cardiology  Procedures: Cardiac cath Antimicrobials:unasyn iv  Subjective: Seen and examined at bedside.  Patient reported spitting red blood.  Denies headache, dizziness, nausea  chest pain.  Objective: Vitals:   11/07/17 0049 11/07/17 0148 11/07/17 0650 11/07/17 0716  BP:    (!) 147/78  Pulse:    88  Resp: 20 16 20  (!) 22  Temp:    (!) 97.5 F (36.4 C)  TempSrc:    Oral  SpO2:    98%  Weight:      Height:        Intake/Output Summary (Last 24 hours) at 11/07/2017 1521 Last data filed at 11/06/2017 2200 Gross per 24 hour  Intake 582.5 ml  Output 500 ml  Net 82.5 ml   Filed Weights   11/04/17 2224 11/05/17 1451  Weight: 83 kg (183 lb) 83.9 kg (185 lb)    Examination:  General exam: Appears calm and comfortable  Respiratory system: Clear to auscultation. Respiratory effort normal. No wheezing or crackle Cardiovascular system: S1 & S2 heard, RRR.  No pedal edema. Gastrointestinal system: Abdomen is nondistended, soft and nontender. Normal bowel sounds heard. Central nervous system: Alert and oriented. No focal neurological deficits. Extremities: Symmetric 5 x 5 power. Skin: No rashes, lesions or ulcers  Data Reviewed: I have personally reviewed following labs and imaging studies  CBC: Recent Labs  Lab 11/04/17 2037 11/05/17 0500 11/06/17 0445  WBC 4.9 5.4 4.0  NEUTROABS  --  4.4  --   HGB 10.1* 9.7*  9.6*  HCT 31.8* 31.3* 30.3*  MCV 79.5 79.6 80.8  PLT 154 139* 128*   Basic Metabolic Panel: Recent Labs  Lab 11/04/17 2037 11/05/17 0500 11/06/17 0445  NA 138 137 138  K 3.9 4.3 4.3  CL 112* 110 108  CO2 20* 22 25  GLUCOSE 86 120* 137*  BUN 18 21* 22*  CREATININE 0.85 0.93 0.87  CALCIUM 8.6* 8.7* 8.5*   GFR: Estimated Creatinine Clearance: 102.4 mL/min (by C-G formula  based on SCr of 0.87 mg/dL). Liver Function Tests: No results for input(s): AST, ALT, ALKPHOS, BILITOT, PROT, ALBUMIN in the last 168 hours. No results for input(s): LIPASE, AMYLASE in the last 168 hours. No results for input(s): AMMONIA in the last 168 hours. Coagulation Profile: Recent Labs  Lab 11/06/17 0445  INR 0.95   Cardiac Enzymes: Recent Labs  Lab 11/04/17 2342 11/05/17 0359 11/05/17 0500  TROPONINI <0.03 <0.03 <0.03   BNP (last 3 results) No results for input(s): PROBNP in the last 8760 hours. HbA1C: No results for input(s): HGBA1C in the last 72 hours. CBG: No results for input(s): GLUCAP in the last 168 hours. Lipid Profile: Recent Labs    11/05/17 0359  CHOL 139  HDL 53  LDLCALC 54  TRIG 158*  CHOLHDL 2.6   Thyroid Function Tests: No results for input(s): TSH, T4TOTAL, FREET4, T3FREE, THYROIDAB in the last 72 hours. Anemia Panel: No results for input(s): VITAMINB12, FOLATE, FERRITIN, TIBC, IRON, RETICCTPCT in the last 72 hours. Sepsis Labs: No results for input(s): PROCALCITON, LATICACIDVEN in the last 168 hours.  No results found for this or any previous visit (from the past 240 hour(s)).       Radiology Studies: No results found.      Scheduled Meds: . atorvastatin  20 mg Oral Daily  . lamoTRIgine  100 mg Oral BID  . levETIRAcetam  1,000 mg Oral BID  . losartan  100 mg Oral Daily  . metoprolol succinate  25 mg Oral Daily  . pantoprazole  40 mg Oral Daily  . sertraline  100 mg Oral Daily  . sodium chloride flush  3 mL Intravenous Q12H  . ticagrelor  90 mg Oral BID   Continuous Infusions: . sodium chloride    . ampicillin-sulbactam (UNASYN) 1.5 g IVPB Stopped (11/07/17 0743)     LOS: 2 days    Cynthia Cogle Jaynie CollinsPrasad Olene Godfrey, MD [facility] Pager [phone]  If 7PM-7AM, please contact night-coverage [email or web address] Password [email or web address] 11/07/2017, 3:21 PM

## 2017-11-07 NOTE — Progress Notes (Signed)
[facility]  I have accessed this patient chart at the request of the case manager to possibly support home IVA BX.   If patient discharges after hours, please call [phone].   Marvin Young 11/07/2017, 12:05 PM

## 2017-11-08 ENCOUNTER — Inpatient Hospital Stay (HOSPITAL_COMMUNITY): Payer: Self-pay

## 2017-11-08 DIAGNOSIS — R509 Fever, unspecified: Secondary | ICD-10-CM

## 2017-11-08 LAB — CBC
HCT: 33.3 % — ABNORMAL LOW (ref 39.0–52.0)
HEMOGLOBIN: 10.7 g/dL — AB (ref 13.0–17.0)
MCH: 25.7 pg — ABNORMAL LOW (ref 26.0–34.0)
MCHC: 32.1 g/dL (ref 30.0–36.0)
MCV: 80 fL (ref 78.0–100.0)
Platelets: 142 10*3/uL — ABNORMAL LOW (ref 150–400)
RBC: 4.16 MIL/uL — AB (ref 4.22–5.81)
RDW: 23.2 % — ABNORMAL HIGH (ref 11.5–15.5)
WBC: 5.4 10*3/uL (ref 4.0–10.5)

## 2017-11-08 LAB — URINALYSIS, ROUTINE W REFLEX MICROSCOPIC
Bilirubin Urine: NEGATIVE
Glucose, UA: NEGATIVE mg/dL
Hgb urine dipstick: NEGATIVE
Ketones, ur: NEGATIVE mg/dL
LEUKOCYTES UA: NEGATIVE
NITRITE: NEGATIVE
Protein, ur: NEGATIVE mg/dL
SPECIFIC GRAVITY, URINE: 1.018 (ref 1.005–1.030)
pH: 5 (ref 5.0–8.0)

## 2017-11-08 LAB — RAPID URINE DRUG SCREEN, HOSP PERFORMED
AMPHETAMINES: NOT DETECTED
Barbiturates: NOT DETECTED
Benzodiazepines: POSITIVE — AB
COCAINE: NOT DETECTED
OPIATES: POSITIVE — AB
Tetrahydrocannabinol: NOT DETECTED

## 2017-11-08 NOTE — Progress Notes (Signed)
Pt has temp 101.4, tylenol given, will continue to monitor,

## 2017-11-08 NOTE — Progress Notes (Signed)
Patient given morphine and benadryl before unasyn as requested by patient.  Patient was connected to unasyn.  Patient self disconnected himself from the PICC line and said he was going for a walk.  I told him his antibiotic was running and he shouldn't disconnect the PICC line himself as there is a chance of infection.  He said he would be re-connected when he gets back.  I told this patient that he could not leave the floor, he can walk in this hallway but not leave.  He said he was going to get fresh air and would be back.  Charge nurse, Cristin aware who told him as well not to leave the unit.  Patient got on the elevator and charge nurse called security.

## 2017-11-08 NOTE — Progress Notes (Signed)
Dr. Ronalee BeltsBhandari paged and made aware that the patient left the unit.  Dr. Ronalee BeltsBhandari said that it is not ok for the patient to leave the unit on his own.  Chrissie NoaWilliam, [facility] made aware who said to let the patient know if he leaves the unit again by himself, it will be considered leaving AMA.

## 2017-11-08 NOTE — Progress Notes (Signed)
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  Patient was transferred to [facility] for cardiac cath.  He underwent cardiac cath with good revascularization and patent stents in LAD and circumflex.  Patient has been refusing many cares in the hospital and not cooperative with the treatment.  Assessment & Plan:  #Chest pain rule out CAD: Status post cardiac cath with good revascularization and patent stents in LAD circumflex with no disease in RCA and patent SVG to OM.  Currently on medical management.  No chest pain or shortness of breath.  Cardiology consult appreciated.  #Hemoptysis: Reported about 4 ounce of blood in the cough yesterday.  It was checked for gastric occult blood which was positive.  No one saw patient actually vomiting however he is complaining of vomiting of blood.  Patient goes out of the floor frequently and unknown if he is using his PICC line.  Hemoglobin remains stable.  Repeat CBC in the morning.  Holding Lovenox for now.  Continue Protonix IV.  #Acute febrile illness: Unknown etiology.  Patient denies headache, dizziness, cough, shortness of breath, dysuria, urgency.  Chest x-ray was done which was unremarkable.  Check UA.  Continue to monitor temperature curve.  If continued to febrile negative blood culture especially since he has PICC line.  He clinically looks stable.  Continue Tylenol as needed.  #Osteomyelitis of the as per patient and from the prior note: Currently on IV Unasyn.  Case manager was  consulted.  Patient reported that he gets his antibiotics at [facility].   #History of hypertension: Continue losartan, metoprolol.  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  On Keppra, Lamictal.  Seizure precaution.  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  #Noncompliance and not cooperating with medical treatment: Patient is not cooperating with the treatment team.  He has been wondering outside of the floor without permission.  He understands risk of leaving hospital with IV line.  He has a capacity to make clinical decision.  Needs close observation.  Discussed with the nurse.  #Anxiety depression: Continue Zoloft.  No suicidal or homicidal ideation.  DVT prophylaxis: holding lovenox now.  SCD. Code Status: Full code Family Communication: No family at bedside Disposition Plan: Now febrile.  Continue to monitor.  Likely discharge home in 1-2 days.    Consultants:   Cardiology  Procedures: Cardiac cath Antimicrobials:unasyn iv  Subjective: Seen and examined at bedside.  Patient was in the hallway and came to the room.  Reported he still vomiting some blood.  Denies headache, dizziness, nausea vomiting chest pain shortness of breath.  He is mainly focus on his hemoglobin level.  Objective: Vitals:   11/08/17 0700 11/08/17 0730 11/08/17 0800 11/08/17 0944  BP: (!) 124/50 (!) 118/49 (!) 106/46 (!) 142/99  Pulse:  91  (!) 111  Resp: (!) 28 (!) 28 (!) 29 20  Temp:  99.3 F (37.4 C)  99.5 F (37.5 C)  TempSrc:  Oral  Oral  SpO2:  97%  99%  Weight:    83.8 kg (184 lb 11.9 oz)  Height:    5\' 8"  (1.727 m)    Intake/Output Summary (Last 24 hours) at 11/08/2017 1246 Last data filed at 11/07/2017 1927 Gross per 24 hour  Intake 20 ml  Output -  Net 20 ml   Filed Weights   11/04/17 2224 11/05/17 1451 11/08/17 0944  Weight: 83 kg (183 lb) 83.9 kg (185 lb) 83.8 kg (184 lb 11.9  oz)    Examination:  General exam: Walking with IV line, not in distress Respiratory system: Clear bilateral, respiratory for normal.  No wheezing or crackle Cardiovascular system: Regular rate rhythm S1-S2 normal.  No pedal edema. Gastrointestinal system: Soft, nontender.  Nondistended.  Bowel sounds positive Central nervous system: Alert awake and oriented. Extremities: Symmetric 5 x 5 power. Skin: No rashes, lesions or ulcers Psychiatry:  No suicidal or homicidal ideation.  Understand the current clinical condition and plan of care.  Data Reviewed: I have personally reviewed following labs and imaging studies  CBC: Recent Labs  Lab 11/04/17 2037 11/05/17 0500 11/06/17 0445 11/08/17 0500  WBC 4.9 5.4 4.0 5.4  NEUTROABS  --  4.4  --   --   HGB 10.1* 9.7* 9.6* 10.7*  HCT 31.8* 31.3* 30.3* 33.3*  MCV 79.5 79.6 80.8 80.0  PLT 154 139* 128* 142*   Basic Metabolic Panel: Recent Labs  Lab 11/04/17 2037 11/05/17 0500 11/06/17 0445  NA 138 137 138  K 3.9 4.3 4.3  CL 112* 110 108  CO2 20* 22 25  GLUCOSE 86 120* 137*  BUN 18 21* 22*  CREATININE 0.85 0.93 0.87  CALCIUM 8.6* 8.7* 8.5*   GFR: Estimated Creatinine Clearance: 102.4 mL/min (by C-G formula based on SCr of 0.87 mg/dL). Liver Function Tests: No results for input(s): AST, ALT, ALKPHOS, BILITOT, PROT, ALBUMIN in the last 168 hours. No results for input(s): LIPASE, AMYLASE in the last 168 hours. No results for input(s): AMMONIA in the last 168 hours. Coagulation Profile: Recent Labs  Lab 11/06/17 0445  INR 0.95   Cardiac Enzymes: Recent Labs  Lab 11/04/17 2342 11/05/17 0359 11/05/17 0500  TROPONINI <0.03 <0.03 <0.03   BNP (last 3 results) No results for input(s): PROBNP in the last 8760 hours. HbA1C: No results for input(s): HGBA1C in the last 72 hours. CBG: No results for input(s): GLUCAP in the last 168 hours. Lipid Profile: No results for input(s): CHOL, HDL, LDLCALC, TRIG, CHOLHDL, LDLDIRECT in  the last 72 hours. Thyroid Function Tests: No results for input(s): TSH, T4TOTAL, FREET4, T3FREE, THYROIDAB in the last 72 hours. Anemia Panel: No results for input(s): VITAMINB12, FOLATE, FERRITIN, TIBC, IRON, RETICCTPCT in the last 72 hours. Sepsis Labs: No results for input(s): PROCALCITON, LATICACIDVEN in the last 168 hours.  No results found for this or any previous visit (from the past 240 hour(s)).       Radiology Studies: Dg Chest Port 1 View  Result Date: 11/08/2017 CLINICAL DATA:  57 year old male with chest pain and fever. Unstable angina. EXAM: PORTABLE CHEST 1 VIEW COMPARISON:  CTA, chest radiographs chest abdomen and pelvis 11/04/2017. 11/05/2017 FINDINGS: Portable AP semi upright view at 1000 hours. Stable left PICC line. Prior sternotomy and CABG. Stable cardiac size and mediastinal contours. Stable lung volumes in ventilation with no pneumothorax, pulmonary edema, pleural effusion or confluent pulmonary opacity. Visualized tracheal air column is within normal limits. IMPRESSION: No acute cardiopulmonary abnormality. Electronically Signed   By: Odessa FlemingH  Hall M.D.   On: 11/08/2017  10:29        Scheduled Meds: . atorvastatin  20 mg Oral Daily  . lamoTRIgine  100 mg Oral BID  . levETIRAcetam  1,000 mg Oral BID  . losartan  100 mg Oral Daily  . metoprolol succinate  25 mg Oral Daily  . pantoprazole (PROTONIX) IV  40 mg Intravenous Q12H  . sertraline  100 mg Oral Daily  . sodium chloride flush  3 mL Intravenous Q12H  . ticagrelor  90 mg Oral BID   Continuous Infusions: . sodium chloride    . ampicillin-sulbactam (UNASYN) 1.5 g IVPB 1.5 g (11/08/17 1217)     LOS: 3 days    Dron Jaynie Collins, MD [facility] Pager [phone]  If 7PM-7AM, please contact night-coverage [email or web address] Password [facility] 11/08/2017, 12:46 PM

## 2017-11-08 NOTE — Progress Notes (Signed)
Patient returns to 3 east, escorted by ER staff member.  Pt informed of MD position on walking off the floor. Pt agrees to remain on 3 east, and allow therapies to continue. Pt expresses understanding that if he does walk off the floor again it will be considered an AMA departure and he will need to return through the ER for further treatment.   Pt refuses bed alarm. Pt is reconnected to IV antibiotic therapy, agrees to let it finish.

## 2017-11-09 LAB — RESPIRATORY PANEL BY PCR
Adenovirus: NOT DETECTED
BORDETELLA PERTUSSIS-RVPCR: NOT DETECTED
CHLAMYDOPHILA PNEUMONIAE-RVPPCR: NOT DETECTED
CORONAVIRUS 229E-RVPPCR: NOT DETECTED
Coronavirus HKU1: NOT DETECTED
Coronavirus NL63: NOT DETECTED
Coronavirus OC43: NOT DETECTED
INFLUENZA B-RVPPCR: NOT DETECTED
Influenza A: NOT DETECTED
MYCOPLASMA PNEUMONIAE-RVPPCR: NOT DETECTED
Metapneumovirus: NOT DETECTED
Parainfluenza Virus 1: NOT DETECTED
Parainfluenza Virus 2: NOT DETECTED
Parainfluenza Virus 3: NOT DETECTED
Parainfluenza Virus 4: NOT DETECTED
RESPIRATORY SYNCYTIAL VIRUS-RVPPCR: NOT DETECTED
Rhinovirus / Enterovirus: NOT DETECTED

## 2017-11-09 LAB — CBC
HEMATOCRIT: 33.9 % — AB (ref 39.0–52.0)
HEMOGLOBIN: 10.4 g/dL — AB (ref 13.0–17.0)
MCH: 24.2 pg — ABNORMAL LOW (ref 26.0–34.0)
MCHC: 30.7 g/dL (ref 30.0–36.0)
MCV: 78.8 fL (ref 78.0–100.0)
Platelets: 162 10*3/uL (ref 150–400)
RBC: 4.3 MIL/uL (ref 4.22–5.81)
RDW: 22.5 % — AB (ref 11.5–15.5)
WBC: 3.3 10*3/uL — AB (ref 4.0–10.5)

## 2017-11-09 NOTE — Progress Notes (Addendum)
Patient slept during the night without complains or concerns and without attempting to leave the unit.Patient was all night in his room, resting.  This morning per NT patient is refusing weight and refusing to get up  due to upset stomach and nausea, requesting Zofran. Zofran was given and patient was asked when nausea started. He stated  that he vomited twice last night and blood was present. Patient was educated that if this happens again he needs to call nurse immediately.When Zofran was given Sodium Chloride Infusion was continued KVO, and patient stated "Disconnect me from this or I will disconnect myself". I explained that per policy I am not allowed to disconnect him from PICC line, however I will notified IV team nurse. Immediately after patient disconnected himself from IV fluids and stated that I can leave and turn the lights down so that he can rest since he is tired.Stated he does not need my assistance or IV team assistance.  Educated patient that he should not be doing that since there is a chance of infection.Patient verbalized understanding.   Will continue to monitor.  Brenin Heidelberger, RN

## 2017-11-09 NOTE — Progress Notes (Signed)
Lab staff was successful getting one set of blood culture sample.  Second set of blood culture could not be obtained from the PICC as patient wanted because patient has had the PICC line for longer than 24 hours. Elnita MaxwellSalome N Daysean Tinkham, RN

## 2017-11-09 NOTE — Progress Notes (Signed)
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  Patient was transferred to [facility] for cardiac cath.  He underwent cardiac cath with good revascularization and patent stents in LAD and circumflex.  Patient has been refusing many cares in the hospital and not cooperative with the treatment.  Assessment & Plan:  #Chest pain rule out CAD: Status post cardiac cath with good revascularization and patent stents in LAD circumflex with no disease in RCA and patent SVG to OM.  Currently on medical management.  No chest pain or shortness of breath.  Cardiology consult appreciated.  #Hemoptysis: Patient continue to complain of coughing up blood but no evidence since yesterday.  Hemoglobin is stable.  Currently Lovenox on hold.  On IV Protonix.  Monitor CBC.   #Acute febrile illness: Unknown etiology.?  Bacteremia especially since it is reported that patient was cleaning the PICC line himself.  Patient was having temperature of 101 today.  I will check 2 sets of blood culture.  He is on Unasyn for other indication.  Follow-up RSV.  The PICC line site looks clean.  Patient denies headache, dizziness, nausea vomiting cough, urinary symptoms are diarrhea.  Continue to monitor.   Chest x-ray with no pneumonia. UA unremarkable. Tylenol as needed.  #Osteomyelitis of the as per patient and from the prior note: Currently on IV Unasyn.  Case manager was consulted.  Patient reported that he gets his antibiotics at [facility].   #History of  hypertension: Continue losartan, metoprolol.  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  On Keppra, Lamictal.  Seizure precaution.  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  #Noncompliance and not cooperating with medical treatment: Patient is not cooperating with the treatment team.  He has been wondering outside of the floor without permission.  He understands risk of leaving hospital with IV line.  He has a capacity to make clinical decision.  Continue to observe closely.  #Anxiety depression: Continue Zoloft.  No suicidal or homicidal ideation.  DVT prophylaxis: holding lovenox now.  SCD. Code Status: Full code Family Communication: No family at bedside Disposition Plan: Now febrile.  Continue to monitor.  Likely discharge home in 1-2 days.    Consultants:   Cardiology  Procedures: Cardiac cath Antimicrobials:unasyn iv  Subjective: Seen and examined at bedside.  Having fever.  Denied headache, dizziness, nausea, vomiting, chest pain, shortness of breath, abdominal pain, dysuria, urgency, diarrhea or constipation. Objective: Vitals:   11/08/17 2138 11/09/17 0138 11/09/17 0634 11/09/17 1223  BP: 128/64 (!) 118/59 (!) 96/52 (!) 101/59  Pulse: (!) 103 82 87 90  Resp: 20  19 20   Temp: 99.6 F (37.6 C) 99 F (37.2 C) 98.4 F (36.9 C) (!) 101.3 F (38.5 C)  TempSrc: Oral Oral Oral Oral  SpO2: 95% 97% 93% 91%  Weight:      Height:        Intake/Output Summary (Last 24 hours) at 11/09/2017 1228 Last data filed at 11/09/2017 0641 Gross per 24 hour  Intake 220  ml  Output 0 ml  Net 220 ml   Filed Weights   11/04/17 2224 11/05/17 1451 11/08/17 0944  Weight: 83 kg (183 lb) 83.9 kg (185 lb) 83.8 kg (184 lb 11.9 oz)    Examination:  General exam: Lying in bed comfortable, not in distress Respiratory system: Clear bilateral, respiratory for normal. Cardiovascular system:  Irregular rate rhythm S1-S2 normal. Gastrointestinal system: Soft, nontender.  Bowel sounds positive. Central nervous system: Alert awake and oriented. Extremities: Symmetric 5 x 5 power. Skin: No rashes, lesions or ulcers Psychiatry:  No suicidal or homicidal ideation.  Understand the current clinical condition and plan of care. PICC line site clean with no redness or erythema.  Data Reviewed: I have personally reviewed following labs and imaging studies  CBC: Recent Labs  Lab 11/04/17 2037 11/05/17 0500 11/06/17 0445 11/08/17 0500 11/09/17 0524  WBC 4.9 5.4 4.0 5.4 3.3*  NEUTROABS  --  4.4  --   --   --   HGB 10.1* 9.7* 9.6* 10.7* 10.4*  HCT 31.8* 31.3* 30.3* 33.3* 33.9*  MCV 79.5 79.6 80.8 80.0 78.8  PLT 154 139* 128* 142* 162   Basic Metabolic Panel: Recent Labs  Lab 11/04/17 2037 11/05/17 0500 11/06/17 0445  NA 138 137 138  K 3.9 4.3 4.3  CL 112* 110 108  CO2 20* 22 25  GLUCOSE 86 120* 137*  BUN 18 21* 22*  CREATININE 0.85 0.93 0.87  CALCIUM 8.6* 8.7* 8.5*   GFR: Estimated Creatinine Clearance: 102.4 mL/min (by C-G formula based on SCr of 0.87 mg/dL). Liver Function Tests: No results for input(s): AST, ALT, ALKPHOS, BILITOT, PROT, ALBUMIN in the last 168 hours. No results for input(s): LIPASE, AMYLASE in the last 168 hours. No results for input(s): AMMONIA in the last 168 hours. Coagulation Profile: Recent Labs  Lab 11/06/17 0445  INR 0.95   Cardiac Enzymes: Recent Labs  Lab 11/04/17 2342 11/05/17 0359 11/05/17 0500  TROPONINI <0.03 <0.03 <0.03   BNP (last 3 results) No results for input(s): PROBNP in the last 8760 hours. HbA1C: No results for input(s): HGBA1C in the last 72 hours. CBG: No results for input(s): GLUCAP in the last 168 hours. Lipid Profile: No results for input(s): CHOL, HDL, LDLCALC, TRIG, CHOLHDL, LDLDIRECT in the last 72 hours. Thyroid Function Tests: No results for input(s): TSH, T4TOTAL, FREET4, T3FREE, THYROIDAB in the  last 72 hours. Anemia Panel: No results for input(s): VITAMINB12, FOLATE, FERRITIN, TIBC, IRON, RETICCTPCT in the last 72 hours. Sepsis Labs: No results for input(s): PROCALCITON, LATICACIDVEN in the last 168 hours.  No results found for this or any previous visit (from the past 240 hour(s)).       Radiology Studies: Dg Chest Port 1 View  Result Date: 11/08/2017 CLINICAL DATA:  57 year old male with chest pain and fever. Unstable angina. EXAM: PORTABLE CHEST 1 VIEW COMPARISON:  CTA, chest radiographs chest abdomen and pelvis 11/04/2017. 11/05/2017 FINDINGS: Portable AP semi upright view at 1000 hours. Stable left PICC line. Prior sternotomy and CABG. Stable cardiac size and mediastinal contours. Stable lung volumes in ventilation with no pneumothorax, pulmonary edema, pleural effusion or confluent pulmonary opacity. Visualized tracheal air column is within normal limits. IMPRESSION: No acute cardiopulmonary abnormality. Electronically Signed   By: Odessa Fleming M.D.   On: 11/08/2017 10:29        Scheduled Meds: . atorvastatin  20 mg Oral Daily  . lamoTRIgine  100 mg Oral BID  . levETIRAcetam  1,000 mg Oral BID  .  losartan  100 mg Oral Daily  . metoprolol succinate  25 mg Oral Daily  . pantoprazole (PROTONIX) IV  40 mg Intravenous Q12H  . sertraline  100 mg Oral Daily  . sodium chloride flush  3 mL Intravenous Q12H  . ticagrelor  90 mg Oral BID   Continuous Infusions: . sodium chloride    . ampicillin-sulbactam (UNASYN) 1.5 g IVPB Stopped (11/09/17 1035)     LOS: 4 days    Aleczander Fandino Jaynie CollinsPrasad Ameliah Baskins, MD [facility] Pager [phone]  If 7PM-7AM, please contact night-coverage [email or web address] Password [email or web address] 11/09/2017, 12:28 PM

## 2017-11-09 NOTE — Progress Notes (Signed)
Patient refusing his PO meds this morning.  Nurse has tried twice.  Patient only wanted to have his IV antibiotic and IV benadryl.  Elnita MaxwellSalome N Xochitl Egle, RN

## 2017-11-10 LAB — CBC
HEMATOCRIT: 34.1 % — AB (ref 39.0–52.0)
Hemoglobin: 10.8 g/dL — ABNORMAL LOW (ref 13.0–17.0)
MCH: 24.7 pg — ABNORMAL LOW (ref 26.0–34.0)
MCHC: 31.7 g/dL (ref 30.0–36.0)
MCV: 78 fL (ref 78.0–100.0)
Platelets: 95 10*3/uL — ABNORMAL LOW (ref 150–400)
RBC: 4.37 MIL/uL (ref 4.22–5.81)
RDW: 21.7 % — ABNORMAL HIGH (ref 11.5–15.5)
WBC: 2.2 10*3/uL — AB (ref 4.0–10.5)

## 2017-11-10 LAB — BASIC METABOLIC PANEL
ANION GAP: 10 (ref 5–15)
BUN: 12 mg/dL (ref 6–20)
CO2: 23 mmol/L (ref 22–32)
Calcium: 8.4 mg/dL — ABNORMAL LOW (ref 8.9–10.3)
Chloride: 105 mmol/L (ref 101–111)
Creatinine, Ser: 1.07 mg/dL (ref 0.61–1.24)
GFR calc non Af Amer: >60 mL/min (ref >60)
GLUCOSE: 102 mg/dL — AB (ref 65–99)
POTASSIUM: 3.6 mmol/L (ref 3.5–5.1)
Sodium: 138 mmol/L (ref 135–145)

## 2017-11-10 MED ORDER — ENOXAPARIN SODIUM 80 MG/0.8ML ~~LOC~~ SOLN
80.0000 mg | Freq: Two times a day (BID) | SUBCUTANEOUS | Status: DC
  Filled 2017-11-10 (×3): qty 0.8

## 2017-11-10 MED ORDER — SODIUM CHLORIDE 0.9 % IV BOLUS (SEPSIS): 500.0000 mL | Freq: Once | INTRAVENOUS | Status: DC

## 2017-11-10 MED ORDER — DIPHENHYDRAMINE HCL 25 MG PO CAPS: 50.0000 mg | ORAL_CAPSULE | Freq: Four times a day (QID) | ORAL | Status: DC | PRN

## 2017-11-10 MED ORDER — DIPHENHYDRAMINE HCL 50 MG/ML IJ SOLN
50.0000 mg | Freq: Four times a day (QID) | INTRAMUSCULAR | Status: DC | PRN
  Administered 2017-11-10 – 2017-11-11 (×2): 50 mg via INTRAVENOUS
  Filled 2017-11-10 (×2): qty 1

## 2017-11-10 MED ORDER — PANTOPRAZOLE SODIUM 40 MG PO TBEC
40.0000 mg | DELAYED_RELEASE_TABLET | Freq: Two times a day (BID) | ORAL | Status: DC
  Administered 2017-11-10 (×2): 40 mg via ORAL
  Filled 2017-11-10 (×3): qty 1

## 2017-11-10 MED ORDER — DIPHENHYDRAMINE HCL 50 MG/ML IJ SOLN: 50.0000 mg | Freq: Four times a day (QID) | INTRAMUSCULAR | Status: DC | PRN

## 2017-11-10 MED ORDER — DIPHENHYDRAMINE HCL 25 MG PO CAPS
50.0000 mg | ORAL_CAPSULE | Freq: Four times a day (QID) | ORAL | Status: DC | PRN
  Administered 2017-11-10: 50 mg via ORAL
  Filled 2017-11-10: qty 2

## 2017-11-10 NOTE — Progress Notes (Addendum)
Patient is not cooperating with the treatment plan and not following medical recommendation.  He insisted on getting IV Benadryl with his IV antibiotics and not willing to undergo testing or recommendation.  I think he understands the risk and he still refuses.  He can make medical decision.  Apparently, he is on IV antibiotics for possible jaw infection prescribed at [facility] as per patient.  The case manager unable to verify the prescribing physician and the antibiotics.  He still has PICC line which he manipulates himself against medical recommendation.  I have discussed this with Dr.Comer from infectious disease. Plan to get x-ray of mandible to see if he has any jaw disease.  Depending on the result and ID evaluation we will decide if patient needs antibiotics IV versus oral. He has no bleeding, thrombocytopenia noted. Follow-up culture results and monitor fever curve. Education provided to the patient.  Very difficult to explain to the patient and not willing to accept most of the recommendation. Check ESR and CRP in am lab

## 2017-11-10 NOTE — Progress Notes (Addendum)
RN came in to introduce himself, perform assessment, and educate patient about his current plan of care. Pt refused. Pt asked this RN to come back in for assessment "in an hour or 2". RN agreed. Pt responded to RN's pain assessment, however. Pt described 4/10 sharp chest pain "that I just deal with". Pt refused any further intervention/assesment. Upon leaving the room, this RN encouraged the pt to call if any needs arose. Will continue to monitor.

## 2017-11-10 NOTE — Progress Notes (Signed)
X-ray tech here to take patient to xray for pics of jaw to help determine efficacy of current IV abx vs oral abx.   Patient refuses to go to xray. Stating that his IV Unasyn will be delivered at this home tmrw and he will not allow anyone to change his IV regimen.   MD made aware of patients refusal for xray

## 2017-11-10 NOTE — Progress Notes (Signed)
Nursing supervisor, ChiropodistAssistant Director, Press photographerCharge Nurse and security went down to speak with the patient regarding recording and taking pictures of nursing staff. It was reported by the previous nursing unit that he had done so and this morning the nursing staff believes he did it today. He was pointing his cell phone at the nurses while they spoke to him. He has been informed that all employees has the right to privacy and that at no time is he permitted to take pictures or record them. After being very argument ive and threatening he acknowledged that he understood the policy. He was asked if we could view his cell phone to assure that he did not record or take pictures of staff and he declined to do so. He has been informed that any more reports of him recording staff will result in being asked to leave. His attending will be made aware of this conversation by nursing leadership.

## 2017-11-10 NOTE — Progress Notes (Addendum)
CM notes from 11/07/2017     Brent Bullaaylor, Deborah C, RN  Case Manager  CASE MANAGEMENT  Care Management Note  Addendum  Date of Service:  11/07/2017 9:48 AM               Case Management Note  Patient Details  Name: Wyline CopasMicheal Hudnall MRN: [id number] Date of Birth: Mar 10, 1961  Subjective/Objective:  [facility] patient states he is a [employer], [place] [facility] [place] ,they do not have patient listed.  He was previously on brilinta before coming to the hospital.  Also financial counseling came up to see him , they could not verify any insurance.  NCM will assist patient with Match Letter for Medication assist.  Patient is stating that [facility] and he has been doing this at home himself. He states his Case Manager name is Genevie CheshireBilly but he does not know the last name.  NCM asked who prescribed the iv abx for him, he states Dr. Franklyn LorUnyis at [place].  He could not give NCM a full name.  [place] and [facility]  NCM called to make apt, they state he has not been there before, apt made for new patient apt 3/7 at 3:30, put on AVS. NCM gave patient Match Letter for med ast.  Patient states he is on Unasyn 1.5 for three weeks, NCM can not verify this information, will inform MD.  Irish Lack Taylor RN CM

## 2017-11-10 NOTE — Progress Notes (Signed)
Patient did not report any episodes of emesis this shift.  Had 4 packs graham cracker and 2 cups of ice cream for snack.  No complaint of pain or discomfort.  Refused to have vitals and weight taken this am.

## 2017-11-10 NOTE — Progress Notes (Signed)
Patient due for IV abx at this time, but refuses as IV benadryl has been changed to PO. Pt requesting to speak to MD. Dr. Ronalee BeltsBhandari paged and physician states he has no plan to change PO benadryl back to IV as pt is able to swallow meds without difficulty. Patient declining to take IV abx without IV benadryl. This scriber has discussed order change with AD, charge nurse and rx on unit. All agree no need for IV benadryl and that PO is in order. Explained to patient that it is intended for him to be discharged tmrw and weaning from IV meds to PO. Pt verbally unaccepting of this order.

## 2017-11-10 NOTE — Progress Notes (Signed)
This scriber and charge RN presented to patient the option of taking IV med as rx'd with PO benadryl and again pt declines IV med without IV benadryl. Pt also requesting IV Ativan along with IV benadryl at this time.  Pt made aware of option to leave AMA if he so desires. Pt resonds "are you trying to kill me, I don't trust you" Pt requesting to see computer screen. Pt holding his cell phone and moving with nurse movement in room. Pt educated on HIPPA , unable to show computer. RN can't write orders/override MD orders. Pt refuses all meds until he receives his requested IV benadryl and ativan.

## 2017-11-10 NOTE — Progress Notes (Signed)
Dr. Ronalee BeltsBhandari called to say he will check with ID to see if IV Unasyn can be changed out to PO abx.

## 2017-11-10 NOTE — Progress Notes (Signed)
This writer heard pt IV sounding went into room to restart iv pump.  Pt asked this writer to disconnect his IVClinical research associate this Clinical research associatewriter explained that IV team had to disconnect his IV d/t it being a picc.  Pt then told this writer to cut off IV, told pt I would get IV team.  IV team happen to be on floor to disconnect pt.

## 2017-11-10 NOTE — Progress Notes (Addendum)
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  Patient was [facility] to [facility].  He underwent cardiac cath with good revascularization and patent stents in LAD and circumflex.  Patient has been refusing many cares in the hospital and not cooperative with the treatment.  Assessment & Plan:  #Chest pain rule out CAD: Status post cardiac cath with good revascularization and patent stents in LAD circumflex with no disease in RCA and patent SVG to OM.  Currently on medical management.  No chest pain or shortness of breath.  Cardiology consult appreciated.  #Hemoptysis: Patient is reporting coughing up blood but none of the nurses has seen since the first day.  His hemoglobin has been stable.  I have a doubt if this is truly hemoptysis.  I will resume Lovenox today.  Repeat CBC in the morning.  I have discussed this with the patient and he understands.  #Acute febrile illness: Unknown etiology.?  Bacteremia especially since it is reported that patient was cleaning the PICC line himself.   -Patient is afebrile since yesterday.  Blood culture is negative so far.  Continue to monitor till tomorrow to monitor fever and blood culture result.  RSV negative. Chest x-ray with no pneumonia. UA unremarkable. Tylenol as needed.  #Osteomyelitis of the as per patient and from the prior note: Currently on IV Unasyn.  Case manager was consulted.  Patient reported that he gets his antibiotics at [facility].  I have discussed with  the patient and he stated that he will get his IV antibiotics and Lovenox [facility] after discharge from here.  #History of hypertension: Continue losartan, metoprolol.  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  On Keppra, Lamictal.  Seizure precaution.  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  #Noncompliance and not cooperating with medical treatment: Patient is not cooperating with the treatment team.   -Change Protonix and Benadryl to oral today.  He is able to tolerate diet well.  His abdomen exam is benign.  Patient is not fully cooperating with the treatment.  He understands the risk.  #Anxiety depression: Continue Zoloft.  Mood is stable.  Addendum: I spoke with the patient over the phone.  He reported taking Benadryl 50 mg IV every 6 hours as needed before antibiotics.  He said he has some boxes of IV Benadryl with him which was verified by patient's nurse.  The order changed.  DVT prophylaxis: holding lovenox now.  SCD. Code Status: Full code Family Communication: No family at bedside Disposition Plan: Now febrile.  Continue to monitor.  Likely discharge home in 1-2 days.    Consultants:   Cardiology  Procedures: Cardiac cath Antimicrobials:unasyn iv  Subjective: Seen and examined at bedside.  Still reported coughing up blood but no one saw him coughing.  Denied nausea vomiting chest pain shortness of breath.  Afebrile since yesterday.    Objective: Vitals:   11/09/17 0138 11/09/17 0634 11/09/17 1223 11/09/17 2017  BP: (!) 118/59 (!) 96/52 Marland Kitchen(!)  101/59 107/61  Pulse: 82 87 90 90  Resp:  19 20 18   Temp: 99 F (37.2 C) 98.4 F (36.9 C) (!) 101.3 F (38.5 C) 98.3 F (36.8 C)  TempSrc: Oral Oral Oral Oral  SpO2: 97% 93% 91% 100%  Weight:      Height:        Intake/Output Summary (Last 24 hours) at 11/10/2017 1031 Last data filed at 11/10/2017 0930 Gross per  24 hour  Intake 440 ml  Output -  Net 440 ml   Filed Weights   11/04/17 2224 11/05/17 1451 11/08/17 0944  Weight: 83 kg (183 lb) 83.9 kg (185 lb) 83.8 kg (184 lb 11.9 oz)    Examination:  General exam: Lying in bed, not in distress Respiratory system: Clear bilateral, respiratory for normal. Cardiovascular system: Regular rate rhythm S1-S2 normal.. Gastrointestinal system: Soft, nontender.  Bowel sounds positive. Central nervous system: Alert awake and oriented. Skin: No rashes, lesions or ulcers Psychiatry:  No suicidal or homicidal ideation.  Understand the current clinical condition and plan of care. PICC line site clean with no redness or erythema.  Data Reviewed: I have personally reviewed following labs and imaging studies  CBC: Recent Labs  Lab 11/05/17 0500 11/06/17 0445 11/08/17 0500 11/09/17 0524 11/10/17 0409  WBC 5.4 4.0 5.4 3.3* 2.2*  NEUTROABS 4.4  --   --   --   --   HGB 9.7* 9.6* 10.7* 10.4* 10.8*  HCT 31.3* 30.3* 33.3* 33.9* 34.1*  MCV 79.6 80.8 80.0 78.8 78.0  PLT 139* 128* 142* 162 95*   Basic Metabolic Panel: Recent Labs  Lab 11/04/17 2037 11/05/17 0500 11/06/17 0445 11/10/17 0409  NA 138 137 138 138  K 3.9 4.3 4.3 3.6  CL 112* 110 108 105  CO2 20* 22 25 23   GLUCOSE 86 120* 137* 102*  BUN 18 21* 22* 12  CREATININE 0.85 0.93 0.87 1.07  CALCIUM 8.6* 8.7* 8.5* 8.4*   GFR: Estimated Creatinine Clearance: 83.3 mL/min (by C-G formula based on SCr of 1.07 mg/dL). Liver Function Tests: No results for input(s): AST, ALT, ALKPHOS, BILITOT, PROT, ALBUMIN in the last 168 hours. No results for input(s): LIPASE, AMYLASE in the last 168 hours. No results for input(s): AMMONIA in the last 168 hours. Coagulation Profile: Recent Labs  Lab 11/06/17 0445  INR 0.95   Cardiac Enzymes: Recent Labs  Lab 11/04/17 2342 11/05/17 0359 11/05/17 0500  TROPONINI <0.03 <0.03 <0.03   BNP (last 3 results) No results for input(s): PROBNP in the last 8760  hours. HbA1C: No results for input(s): HGBA1C in the last 72 hours. CBG: No results for input(s): GLUCAP in the last 168 hours. Lipid Profile: No results for input(s): CHOL, HDL, LDLCALC, TRIG, CHOLHDL, LDLDIRECT in the last 72 hours. Thyroid Function Tests: No results for input(s): TSH, T4TOTAL, FREET4, T3FREE, THYROIDAB in the last 72 hours. Anemia Panel: No results for input(s): VITAMINB12, FOLATE, FERRITIN, TIBC, IRON, RETICCTPCT in the last 72 hours. Sepsis Labs: No results for input(s): PROCALCITON, LATICACIDVEN in the last 168 hours.  Recent Results (from the past 240 hour(s))  Respiratory Panel by PCR     Status: None   Collection Time: 11/08/17  6:34 PM  Result Value Ref Range Status   Adenovirus NOT DETECTED NOT DETECTED Final   Coronavirus 229E NOT DETECTED NOT DETECTED Final   Coronavirus HKU1 NOT DETECTED NOT DETECTED Final   Coronavirus NL63 NOT DETECTED NOT DETECTED Final   Coronavirus OC43 NOT DETECTED NOT DETECTED  Final   Metapneumovirus NOT DETECTED NOT DETECTED Final   Rhinovirus / Enterovirus NOT DETECTED NOT DETECTED Final   Influenza A NOT DETECTED NOT DETECTED Final   Influenza B NOT DETECTED NOT DETECTED Final   Parainfluenza Virus 1 NOT DETECTED NOT DETECTED Final   Parainfluenza Virus 2 NOT DETECTED NOT DETECTED Final   Parainfluenza Virus 3 NOT DETECTED NOT DETECTED Final   Parainfluenza Virus 4 NOT DETECTED NOT DETECTED Final   Respiratory Syncytial Virus NOT DETECTED NOT DETECTED Final   Bordetella pertussis NOT DETECTED NOT DETECTED Final   Chlamydophila pneumoniae NOT DETECTED NOT DETECTED Final   Mycoplasma pneumoniae NOT DETECTED NOT DETECTED Final         Radiology Studies: No results found.      Scheduled Meds: . atorvastatin  20 mg Oral Daily  . lamoTRIgine  100 mg Oral BID  . levETIRAcetam  1,000 mg Oral BID  . losartan  100 mg Oral Daily  . metoprolol succinate  25 mg Oral Daily  . pantoprazole  40 mg Oral BID  . sertraline   100 mg Oral Daily  . sodium chloride flush  3 mL Intravenous Q12H  . ticagrelor  90 mg Oral BID   Continuous Infusions: . sodium chloride    . ampicillin-sulbactam (UNASYN) 1.5 g IVPB 1.5 g (11/10/17 0409)     LOS: 5 days    Marvin Bodiford Jaynie Collins, MD [facility] Pager [phone]  If 7PM-7AM, please contact night-coverage [email or web address] Password [facility] 11/10/2017, 10:31 AM

## 2017-11-10 NOTE — Plan of Care (Signed)
  Progressing Safety: Ability to remain free from injury will improve 11/10/2017 0613 - Progressing by Myna BrightSmith, Ashlynne Shetterly Y, RN

## 2017-11-10 NOTE — Progress Notes (Signed)
Unit Pharm stated that she would contact MD to have IV Unasyn changed to PO abx.

## 2017-11-10 NOTE — Progress Notes (Signed)
Patient afebrile with oral temp of 98.2.

## 2017-11-10 NOTE — Progress Notes (Signed)
Pt unable to provide date of insertion of PICC. He verbally stated that ilt wasn't placed here and it would not be removed here.

## 2017-11-11 LAB — CBC
HEMATOCRIT: 33.7 % — AB (ref 39.0–52.0)
HEMOGLOBIN: 10.8 g/dL — AB (ref 13.0–17.0)
MCH: 25.1 pg — AB (ref 26.0–34.0)
MCHC: 32 g/dL (ref 30.0–36.0)
MCV: 78.2 fL (ref 78.0–100.0)
Platelets: 88 10*3/uL — ABNORMAL LOW (ref 150–400)
RBC: 4.31 MIL/uL (ref 4.22–5.81)
RDW: 21.7 % — ABNORMAL HIGH (ref 11.5–15.5)
WBC: 2.5 10*3/uL — ABNORMAL LOW (ref 4.0–10.5)

## 2017-11-11 LAB — C-REACTIVE PROTEIN: CRP: 2.1 mg/dL — ABNORMAL HIGH (ref <1.0)

## 2017-11-11 LAB — BASIC METABOLIC PANEL
Anion gap: 12 (ref 5–15)
BUN: 13 mg/dL (ref 6–20)
CHLORIDE: 109 mmol/L (ref 101–111)
CO2: 20 mmol/L — AB (ref 22–32)
CREATININE: 1.09 mg/dL (ref 0.61–1.24)
Calcium: 8.4 mg/dL — ABNORMAL LOW (ref 8.9–10.3)
GFR calc Af Amer: >60 mL/min (ref >60)
GFR calc non Af Amer: >60 mL/min (ref >60)
GLUCOSE: 107 mg/dL — AB (ref 65–99)
Potassium: 3.8 mmol/L (ref 3.5–5.1)
Sodium: 141 mmol/L (ref 135–145)

## 2017-11-11 LAB — SEDIMENTATION RATE: Sed Rate: 12 mm/hr (ref 0–16)

## 2017-11-11 MED ORDER — PANTOPRAZOLE SODIUM 40 MG PO TBEC: 40.0000 mg | DELAYED_RELEASE_TABLET | Freq: Every day | ORAL | 0 refills | Status: AC

## 2017-11-11 MED ORDER — TICAGRELOR 90 MG PO TABS: 90.0000 mg | ORAL_TABLET | Freq: Two times a day (BID) | ORAL | Status: AC

## 2017-11-11 NOTE — Progress Notes (Signed)
All discharge instructions reviewed with pt , meds, and follow up appt.  Pt has packed all of his personal belongings in backpack and his signature is obtained on d/c papers.  Pt continues to be argumentative with Security while walking up the hall. Bus pass will be provided for pt.  Pt is escorted off the unit with 3 security officers.

## 2017-11-11 NOTE — Progress Notes (Addendum)
Pt has remained afebrile during this shift. Pt also normotensive this AM, although pt only allowed this RN to take his BP in his right leg. Pt refused to stand on scale for weight. Cafeteria dialed and phone given to patient so that he can order breakfast. Will continue to monitor.

## 2017-11-11 NOTE — Progress Notes (Addendum)
BP 75/60 and 80/48 tonight. Temp = 98.6 orally. Pt was febrile on 11/08/17 and 11/09/17. UA negative and CXR negative during this admission. Pt with a PICC line placed prior to admission and possible jaw infection per pt report (pt receiving Unasyn IV). MD paged. Orders received for 500mL NS bolus. Pt informed, but declined manual BP and bolus when this RN attempted to administer. Will continue to monitor.    Pt also refusing bed alarm tonight.

## 2017-11-11 NOTE — Plan of Care (Signed)
  Health Behavior/Discharge Planning: Ability to manage health-related needs will improve 11/11/2017 0736 - Adequate for Discharge by Chaya Janunn, Siomara Burkel K, RN   Clinical Measurements: Ability to maintain clinical measurements within normal limits will improve 11/11/2017 0736 - Adequate for Discharge by Chaya Janunn, Carrah Eppolito K, RN Will remain free from infection 11/11/2017 0736 - Adequate for Discharge by Chaya Janunn, Iyauna Sing K, RN Diagnostic test results will improve 11/11/2017 0736 - Adequate for Discharge by Chaya Janunn, Renea Schoonmaker K, RN Respiratory complications will improve 11/11/2017 0736 - Adequate for Discharge by Chaya Janunn, Lexii Walsh K, RN Cardiovascular complication will be avoided 11/11/2017 [id number] - Adequate for Discharge by Chaya Janunn, Alexei Doswell K, RN   Pain Managment: General experience of comfort will improve 11/11/2017 0736 - Adequate for Discharge by Chaya Janunn, Densel Kronick K, RN   Safety: Ability to remain free from injury will improve 11/11/2017 0736 - Adequate for Discharge by Chaya Janunn, Averey Trompeter K, RN   Skin Integrity: Risk for impaired skin integrity will decrease 11/11/2017 0736 - Adequate for Discharge by Chaya Janunn, Leonetta Mcgivern K, RN

## 2017-11-11 NOTE — Discharge Summary (Addendum)
Physician Discharge Summary  Marvin Young ZOX:[id number] DOB: Sep 30, 1961 DOA: 11/04/2017  PCP: System, Pcp Not In  Admit date: 11/04/2017 Discharge date: 11/11/2017  Admitted From:home Disposition:home  Recommendations for Outpatient Follow-up:  1. Follow up with PCP in 1-2 weeks 2. Please obtain BMP/CBC in one week 3. Please [facility] for [facility], anticoagulation and further management.  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  Patient was transferred to [facility] for cardiac cath.  He underwent cardiac cath with good revascularization and patent stents in LAD and circumflex.   #Chest pain rule out CAD: Status post cardiac cath with good revascularization and patent stents in LAD circumflex with no disease in RCA and patent SVG to OM.  Currently on medical management.    Patient has no chest pain or shortness of breath.  Cardiology consult appreciated.  Recommend to follow-up outpatient.  #Hemoptysis:  Unknown if it is true.  None of the nurses or staff has seen him actually coughing up.  His hemoglobin has been stable.  On Protonix.  He does not have shortness of breath or chest pain.  Tolerating diet well.  No hemoptysis today.  #Acute febrile illness: Unknown etiology.  RSV negative.  Chest x-ray with no pneumonia.  UA unremarkable.  Patient has been afebrile for more than 24 hours.  The blood culture is negative.  Patient is on antibiotics for other  indication.  #Osteomyelitis of the as per patient and from the prior note: Currently on IV Unasyn.  The case manager unable to verify the prescribing physician for antibiotics or clinic.  Patient reported that he gets his care including IV antibiotics, PICC line care from [facility] [place].  He understands that he will call there today to inform that he is coming home and require to resume his care.  He does not want to give all the information to the staff.  -Patient declined the x-ray of his jaws.  He is not cooperating with the treatment plan or any investigation.  He is refusing further evaluation.  -his jaw looks normal, no wound, no redness, no swelling.   #History of hypertension: Continue losartan, metoprolol.  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  On Keppra, Lamictal.  Seizure precaution.  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  On anticoagulation.  No sign of bleeding.  Watch CBC.  #Noncompliance and not cooperating with medical treatment: Patient is alert awake and oriented.  He knows his condition and follow-up plan.  He can make his clinical decision.  He is not cooperating and refusing medical advice and recommendation.  #Anxiety depression: Continue Zoloft.   Mood is a stable.  Discussed with the nursing staff. He said he will go to [place] by train/bus and doesn't need any help.   Discharge Diagnoses:  Principal Problem:   Chest pain Active Problems:   Odontogenic infection of jaw   Seizure disorder [facility])   Iliac artery aneurysm (HCC)   CAD (coronary artery disease)   Hemoptysis  Fever    Discharge Instructions  Discharge Instructions    Call MD for:  difficulty breathing, headache or visual disturbances   Complete by:  As directed    Call MD for:  extreme fatigue   Complete by:  As directed    Call MD for:  hives   Complete by:  As directed     Call MD for:  persistant dizziness or light-headedness   Complete by:  As directed    Call MD for:  persistant nausea and vomiting   Complete by:  As directed    Call MD for:  severe uncontrolled pain   Complete by:  As directed    Call MD for:  temperature >100.4   Complete by:  As directed    Diet - low sodium heart healthy   Complete by:  As directed    Discharge instructions   Complete by:  As directed    Please follow-up with your [facility] for management of PICC line, IV antibiotics and anticoagulation.  Please watch for any sign of bleeding, fever or any clinical symptoms.  Please call your doctor or come to emergency.  Please check your lab tests including CBC, BMP in a week with her PCP.   Increase activity slowly   Complete by:  As directed      Allergies as of 11/11/2017      Reactions   Asa [aspirin] Anaphylaxis   Contrast Media [iodinated Diagnostic Agents] Hives   Sulfur Hives      Medication List    TAKE these medications   ampicillin-sulbactam 1.5 g in sodium chloride 0.9 % 50 mL Inject 1.5 g into the vein every 6 (six) hours.   atorvastatin 20 MG tablet Commonly known as:  LIPITOR Take 20 mg by mouth daily.   diphenhydrAMINE 50 MG/ML injection Commonly known as:  BENADRYL Inject 50 mg into the vein every 6 (six) hours as needed for itching.   enoxaparin 80 MG/0.8ML injection Commonly known as:  LOVENOX Inject 80 mg into the skin every 12 (twelve) hours.   lamoTRIgine 100 MG tablet Commonly known as:  LAMICTAL Take 100 mg by mouth 2 (two) times daily.   levETIRAcetam 1000 MG tablet Commonly known as:  KEPPRA Take 1,000 mg by mouth 2 (two) times daily.   losartan 100 MG tablet Commonly known as:  COZAAR Take 100 mg by mouth daily.   metoprolol succinate 25 MG 24 hr tablet Commonly known as:  TOPROL-XL Take 25 mg by mouth daily.   pantoprazole 40 MG tablet Commonly known as:  PROTONIX Take 1 tablet (40 mg total) by mouth daily.   sertraline  100 MG tablet Commonly known as:  ZOLOFT Take 100 mg by mouth daily.   temazepam 30 MG capsule Commonly known as:  RESTORIL Take 30 mg by mouth at bedtime as needed for sleep.   ticagrelor 90 MG Tabs tablet Commonly known as:  BRILINTA Take 1 tablet (90 mg total) by mouth 2 (two) times daily. What changed:  when to take this      Follow-up Information    Copper, Landis Gandy, PA-C Follow up on 12/25/2017.   Specialty:  Physician Assistant Why:  3:30 for hospital follow up Contact information: [facility] [place] [address] [phone]          Allergies  Allergen Reactions  . Asa [Aspirin] Anaphylaxis  . Contrast Media [Iodinated Diagnostic Agents] Hives  . Sulfur Hives    Consultations: Cardiology  Procedures/Studies: Cath  Subjective: Seen and examined at bedside.  Denies any symptoms.  Reported feeling good.  Denied headache, dizziness, nausea vomiting chest pain shortness of breath.  Tolerate diet well.  No further hemoptysis.  Discharge Exam: Vitals:   11/10/17 2326 11/11/17 0536  BP: (!) 80/48 (!) 116/59  Pulse:    Resp:  18  Temp:  98.1 F (36.7 C)  SpO2:  100%   Vitals:   11/10/17 1800 11/10/17 2323 11/10/17 2326 11/11/17 0536  BP:  (!) 75/60 (!) 80/48 (!) 116/59  Pulse:  84    Resp:  20  18  Temp: 98.2 F (36.8 C) 98.6 F (37 C)  98.1 F (36.7 C)  TempSrc: Oral Oral  Oral  SpO2:  94%  100%  Weight:    82.7 kg (182 lb 5.1 oz)  Height:        General: Pt is alert, awake, not in acute distress Cardiovascular: RRR, S1/S2 +, no rubs, no gallops Respiratory: CTA bilaterally, no wheezing, no rhonchi Abdominal: Soft, NT, ND, bowel sounds + Extremities: no edema, no cyanosis    The results of significant diagnostics from this hospitalization (including imaging, microbiology, ancillary and laboratory) are listed below for reference.     Microbiology: Recent Results (from the past 240 hour(s))  Respiratory Panel by PCR      Status: None   Collection Time: 11/08/17  6:34 PM  Result Value Ref Range Status   Adenovirus NOT DETECTED NOT DETECTED Final   Coronavirus 229E NOT DETECTED NOT DETECTED Final   Coronavirus HKU1 NOT DETECTED NOT DETECTED Final   Coronavirus NL63 NOT DETECTED NOT DETECTED Final   Coronavirus OC43 NOT DETECTED NOT DETECTED Final   Metapneumovirus NOT DETECTED NOT DETECTED Final   Rhinovirus / Enterovirus NOT DETECTED NOT DETECTED Final   Influenza A NOT DETECTED NOT DETECTED Final   Influenza B NOT DETECTED NOT DETECTED Final   Parainfluenza Virus 1 NOT DETECTED NOT DETECTED Final   Parainfluenza Virus 2 NOT DETECTED NOT DETECTED Final   Parainfluenza Virus 3 NOT DETECTED NOT DETECTED Final   Parainfluenza Virus 4 NOT DETECTED NOT DETECTED Final   Respiratory Syncytial Virus NOT DETECTED NOT DETECTED Final   Bordetella pertussis NOT DETECTED NOT DETECTED Final   Chlamydophila pneumoniae NOT DETECTED NOT DETECTED Final   Mycoplasma pneumoniae NOT DETECTED NOT DETECTED Final  Culture, blood (routine x 2)     Status: None (Preliminary result)   Collection Time: 11/09/17  4:09 PM  Result Value Ref Range Status   Specimen Description BLOOD BLOOD LEFT HAND  Final   Special Requests IN PEDIATRIC BOTTLE Blood Culture adequate volume  Final   Culture NO GROWTH < 24 HOURS  Final   Report Status PENDING  Incomplete     Labs: BNP (last 3 results) No results for input(s): BNP in the last 8760 hours. Basic Metabolic Panel: Recent Labs  Lab 11/04/17 2037 11/05/17 0500 11/06/17 0445 11/10/17 0409 11/11/17 0435  NA 138 137 138 138 141  K 3.9 4.3 4.3 3.6 3.8  CL 112* 110 108 105 109  CO2 20* 22 25 23  20*  GLUCOSE 86 120* 137* 102* 107*  BUN 18 21* 22* 12 13  CREATININE 0.85 0.93 0.87 1.07 1.09  CALCIUM 8.6* 8.7* 8.5* 8.4* 8.4*   Liver Function Tests: No results for input(s): AST, ALT, ALKPHOS, BILITOT, PROT, ALBUMIN in the last 168 hours. No results for input(s): LIPASE, AMYLASE in  the last 168 hours. No results for  input(s): AMMONIA in the last 168 hours. CBC: Recent Labs  Lab 11/05/17 0500 11/06/17 0445 11/08/17 0500 11/09/17 0524 11/10/17 0409 11/11/17 0435  WBC 5.4 4.0 5.4 3.3* 2.2* 2.5*  NEUTROABS 4.4  --   --   --   --   --   HGB 9.7* 9.6* 10.7* 10.4* 10.8* 10.8*  HCT 31.3* 30.3* 33.3* 33.9* 34.1* 33.7*  MCV 79.6 80.8 80.0 78.8 78.0 78.2  PLT 139* 128* 142* 162 95* 88*   Cardiac Enzymes: Recent Labs  Lab 11/04/17 2342 11/05/17 0359 11/05/17 0500  TROPONINI <0.03 <0.03 <0.03   BNP: Invalid input(s): POCBNP CBG: No results for input(s): GLUCAP in the last 168 hours. D-Dimer No results for input(s): DDIMER in the last 72 hours. Hgb A1c No results for input(s): HGBA1C in the last 72 hours. Lipid Profile No results for input(s): CHOL, HDL, LDLCALC, TRIG, CHOLHDL, LDLDIRECT in the last 72 hours. Thyroid function studies No results for input(s): TSH, T4TOTAL, T3FREE, THYROIDAB in the last 72 hours.  Invalid input(s): FREET3 Anemia work up No results for input(s): VITAMINB12, FOLATE, FERRITIN, TIBC, IRON, RETICCTPCT in the last 72 hours. Urinalysis    Component Value Date/Time   COLORURINE YELLOW 11/08/2017 0813   APPEARANCEUR CLEAR 11/08/2017 0813   LABSPEC 1.018 11/08/2017 0813   PHURINE 5.0 11/08/2017 0813   GLUCOSEU NEGATIVE 11/08/2017 0813   HGBUR NEGATIVE 11/08/2017 0813   BILIRUBINUR NEGATIVE 11/08/2017 0813   KETONESUR NEGATIVE 11/08/2017 0813   PROTEINUR NEGATIVE 11/08/2017 0813   NITRITE NEGATIVE 11/08/2017 0813   LEUKOCYTESUR NEGATIVE 11/08/2017 0813   Sepsis Labs Invalid input(s): PROCALCITONIN,  WBC,  LACTICIDVEN Microbiology Recent Results (from the past 240 hour(s))  Respiratory Panel by PCR     Status: None   Collection Time: 11/08/17  6:34 PM  Result Value Ref Range Status   Adenovirus NOT DETECTED NOT DETECTED Final   Coronavirus 229E NOT DETECTED NOT DETECTED Final   Coronavirus HKU1 NOT DETECTED NOT DETECTED  Final   Coronavirus NL63 NOT DETECTED NOT DETECTED Final   Coronavirus OC43 NOT DETECTED NOT DETECTED Final   Metapneumovirus NOT DETECTED NOT DETECTED Final   Rhinovirus / Enterovirus NOT DETECTED NOT DETECTED Final   Influenza A NOT DETECTED NOT DETECTED Final   Influenza B NOT DETECTED NOT DETECTED Final   Parainfluenza Virus 1 NOT DETECTED NOT DETECTED Final   Parainfluenza Virus 2 NOT DETECTED NOT DETECTED Final   Parainfluenza Virus 3 NOT DETECTED NOT DETECTED Final   Parainfluenza Virus 4 NOT DETECTED NOT DETECTED Final   Respiratory Syncytial Virus NOT DETECTED NOT DETECTED Final   Bordetella pertussis NOT DETECTED NOT DETECTED Final   Chlamydophila pneumoniae NOT DETECTED NOT DETECTED Final   Mycoplasma pneumoniae NOT DETECTED NOT DETECTED Final  Culture, blood (routine x 2)     Status: None (Preliminary result)   Collection Time: 11/09/17  4:09 PM  Result Value Ref Range Status   Specimen Description BLOOD BLOOD LEFT HAND  Final   Special Requests IN PEDIATRIC BOTTLE Blood Culture adequate volume  Final   Culture NO GROWTH < 24 HOURS  Final   Report Status PENDING  Incomplete     Time coordinating discharge:  30 minutes  SIGNED:   Maxie Barb, MD  [facility] 11/11/2017, 9:24 AM  If 7PM-7AM, please contact night-coverage [email or web address] Password [email or web address]

## 2017-11-11 NOTE — Progress Notes (Signed)
Patient seen dressed in street clothes walking toward elevator. When asked where he was going he states he is just going to get off the unit for a while. Explained to pt that he cannot leave the unit as long as he is a registered pt on this unit. Pt continues to walk towards elevator, This scriber instructed the unit secretary to call security stat. AD present and tells pt the same information. That he is not allowed off the unit.  At this time the pt is instructed to pack all of his belongings because he will not be allowed back on the unit. Pt states that if any staff member touches his belongings he will  Have them arrested.  Pt advised that his discharge papers are ready for his review and signature. Pt states he is not interested.

## 2017-11-11 NOTE — Progress Notes (Signed)
Pt walks back to his room.  Security arrived on unit and heads to pt room. Security explains to pt that he has been discharged and its time to leave the hospital.  Pt states he will not leave hospital until he speaks with the SW. Pt has no SW consult and will be discharging to his home. As opposed to any facility    SW /Case Mgr made aware of pt request but neither have anything to offer patient. Pt had stated  thru - ought his hospitalization that he is  independent and receives his IV antibiotics and [facility] benadryl [facility] [place] [place].

## 2017-11-11 NOTE — Progress Notes (Signed)
Patient is attempting to leave the unit and leave his belongings in the room stating he is not leaving until he speaks to Child psychotherapistsocial worker. The MD has discharged the patient. The nurse manager has spoken to Case Management and Social Work. They have all signed off on the patient. Security has been called to escort the patient off the premises.

## 2017-11-14 LAB — CULTURE, BLOOD (ROUTINE X 2)
Culture: NO GROWTH
Special Requests: ADEQUATE

## 2019-08-06 IMAGING — CT CT ANGIO CHEST-ABD-PELV FOR DISSECTION W/ AND WO/W CM
2 of 7 series · 11 of 36 positions shown, 15 images · IV contrast (isovue)
Comparison: Chest radiograph dated 11/04/2017

CLINICAL DATA: 56-year-old male with chest pain.

EXAM:
CT ANGIOGRAPHY CHEST, ABDOMEN AND PELVIS
TECHNIQUE: Multidetector CT imaging through the chest, abdomen and pelvis was
performed using the standard protocol during bolus administration of
intravenous contrast. Multiplanar reconstructed images and MIPs were
obtained and reviewed to evaluate the vascular anatomy.
CONTRAST:  100 cc Isovue 370

[Series 7: axial arterial · axial · arterial · 0.74mm/px · z∈[-494,+31]mm · 10 of 203 slices shown, 13 images]
[im 14/203  mediastinal]
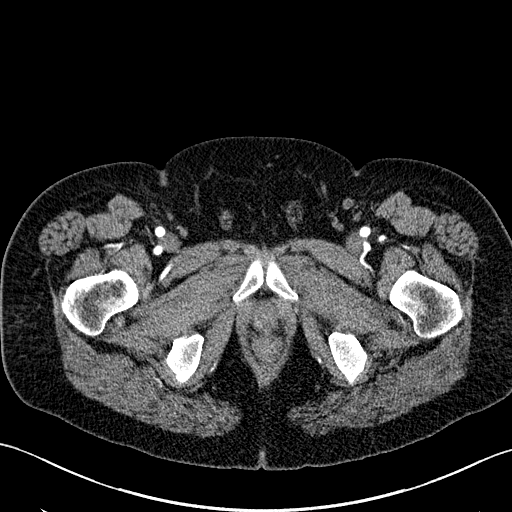
[im 14/203  bone]
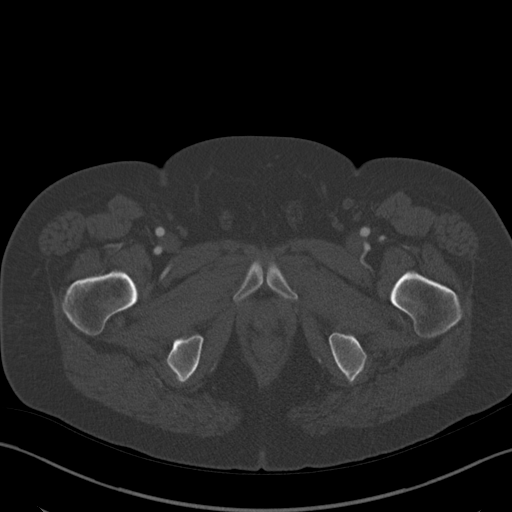
[im 41/203  mediastinal]
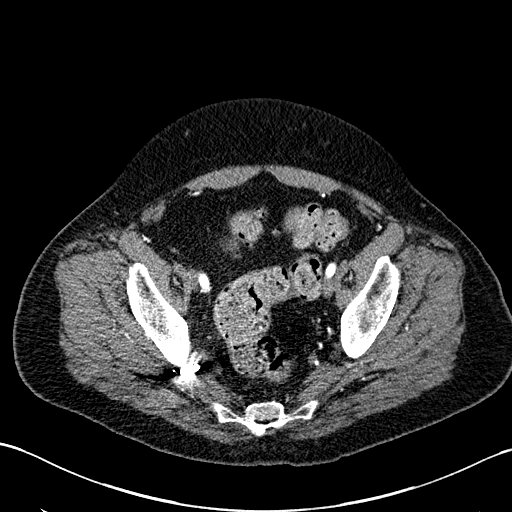
[im 68/203  mediastinal]
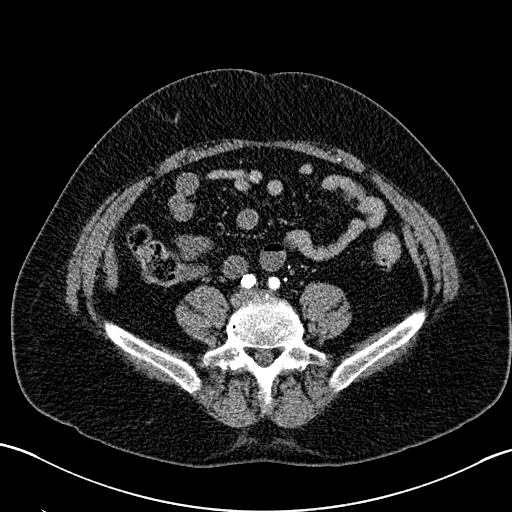
[im 95/203  mediastinal]
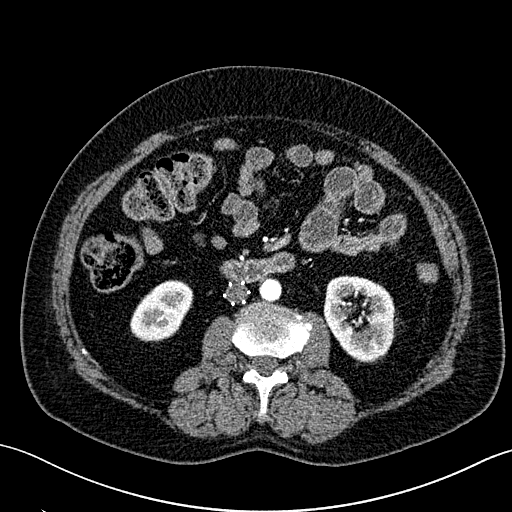
[im 108/203  mediastinal]
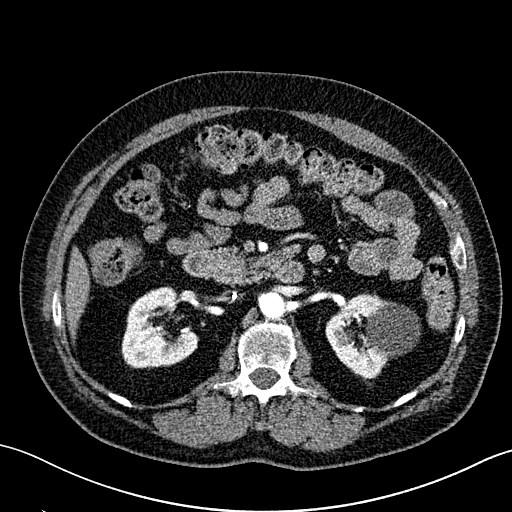
[im 135/203  mediastinal]
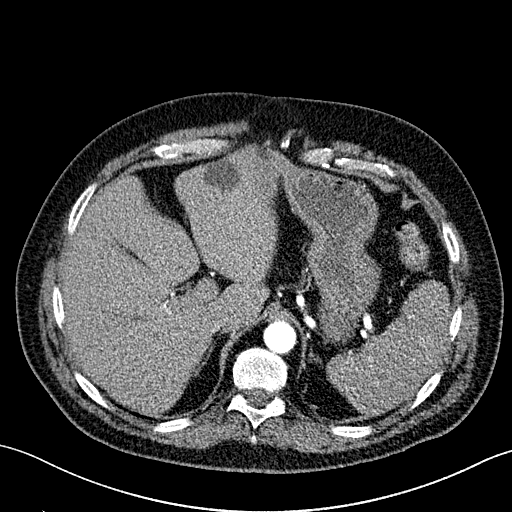
[im 149/203  lung]
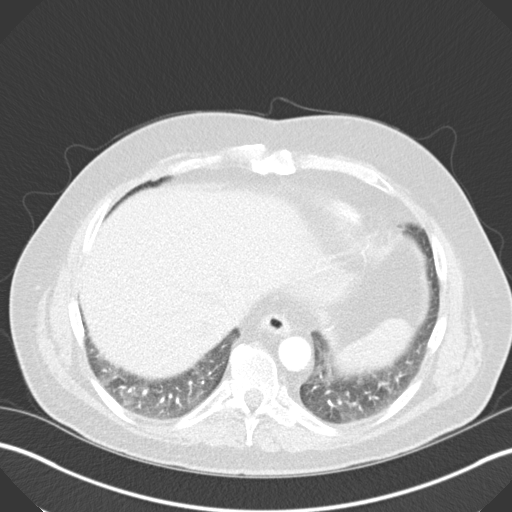
[im 162/203  mediastinal]
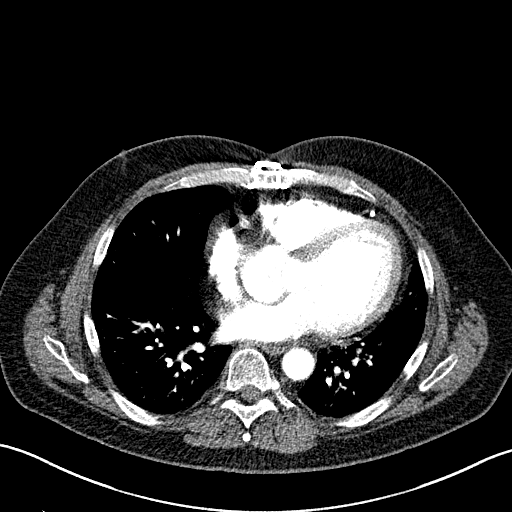
[im 162/203  lung]
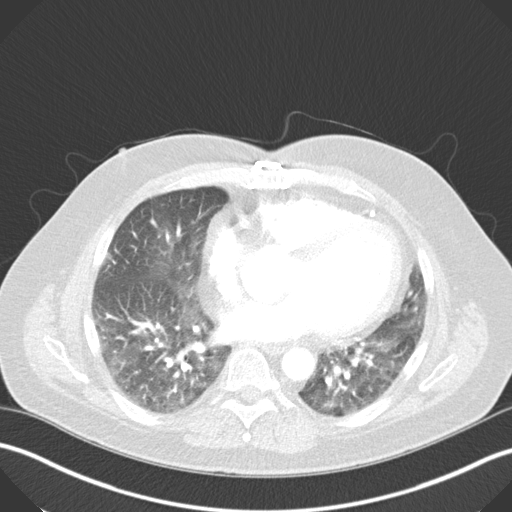
[im 176/203  lung]
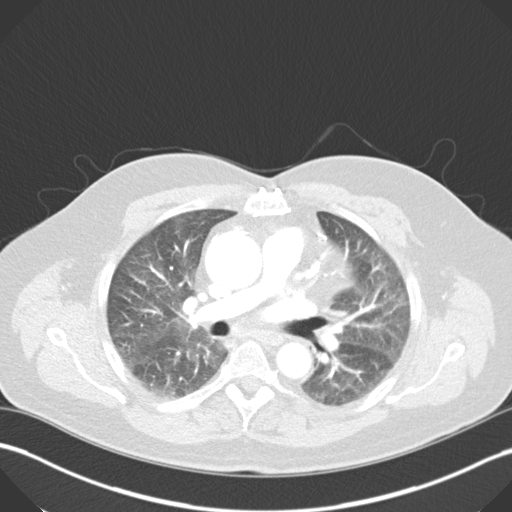
[im 189/203  mediastinal]
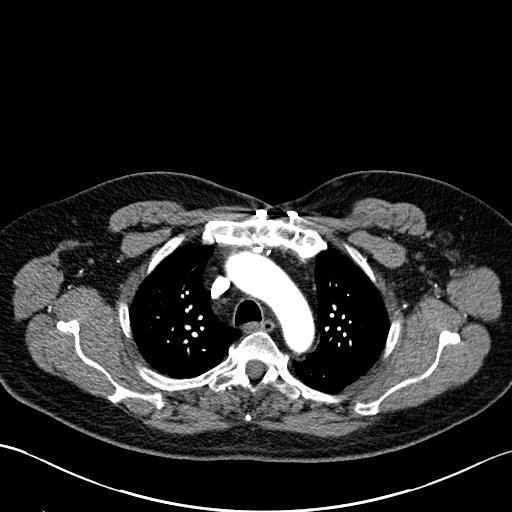
[im 189/203  lung]
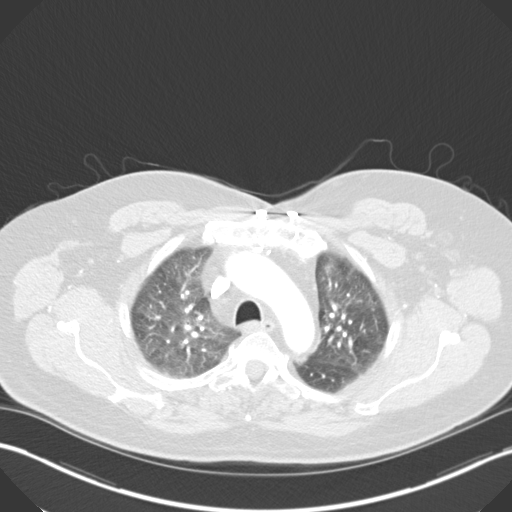

[Series 10: coronals · coronal · 0.71mm/px · 1 of 141 slices shown, 2 images]
[im 71/141  mediastinal]
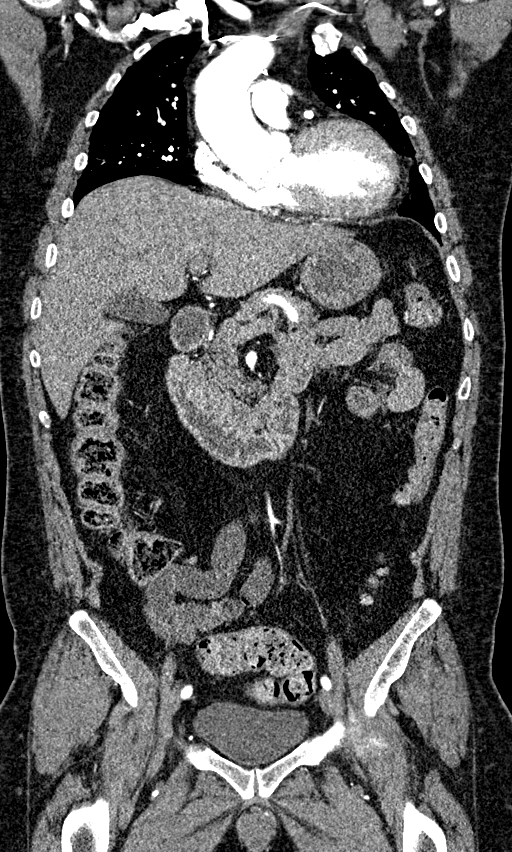
[im 71/141  bone]
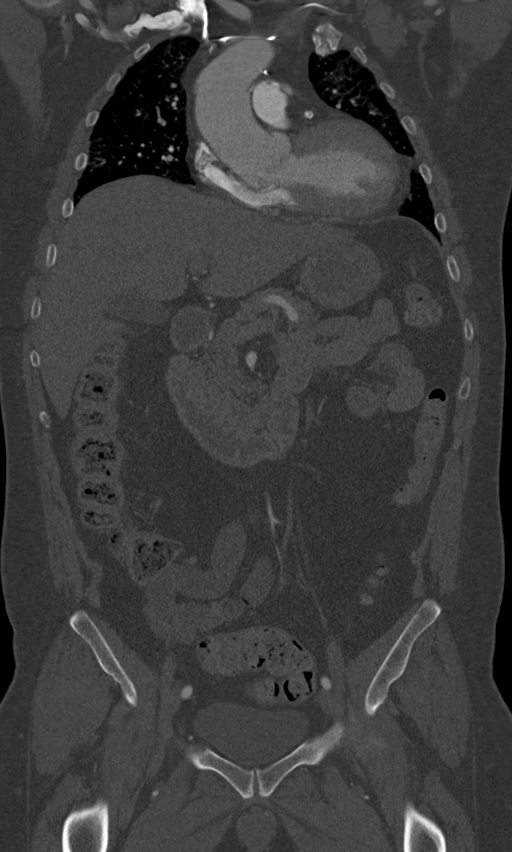

[11 of 36 positions shown; findings below may reference images not displayed]

FINDINGS: CTA CHEST FINDINGS

Cardiovascular: There is mild cardiomegaly. There is coronary
vascular calcification primarily involving the LAD and postsurgical
changes of CABG. No pericardial effusion. Minimally dilated
ascending aorta measures 4.3 cm in diameter. The thoracic aorta is
otherwise unremarkable. No aneurysmal dilatation or evidence of
dissection. Evaluation of the pulmonary arteries is limited due to
respiratory motion artifact and suboptimal opacification of the
peripheral branches. No large or central pulmonary artery embolus
identified. Left-sided PICC with tip in the central SVC close to the
cavoatrial junction.

Mediastinum/Nodes: There is no hilar or mediastinal adenopathy.
Esophagus is grossly unremarkable. No mediastinal fluid collection.

Lungs/Pleura: Mild diffuse interstitial prominence and hazy and
ground-glass airspace densities noted which may represent
atelectatic changes or mild interstitial edema. Clinical correlation
is recommended. There is no focal consolidation, pleural effusion,
or pneumothorax. The central airways are patent.

Musculoskeletal: Median sternotomy wires noted. No acute osseous
pathology.

Review of the MIP images confirms the above findings.

CTA ABDOMEN AND PELVIS FINDINGS

VASCULAR

Aorta: Borderline ectasia infrarenal aorta measures 2 cm in
diameter. There is no aneurysmal dilatation or evidence of
dissection.

Celiac: Patent without evidence of aneurysm, dissection, vasculitis
or significant stenosis.

SMA: Patent without evidence of aneurysm, dissection, vasculitis or
significant stenosis.

Renals: Minimal atherosclerotic calcification of the origin of the
right renal artery. The left renal artery is unremarkable. The renal
arteries are patent.

IMA: Patent without evidence of aneurysm, dissection, vasculitis or
significant stenosis.

Inflow: Right common iliac artery endovascular stent. The stent
appears patent. There is coil embolization of the right internal
iliac artery. The right external iliac artery and the left iliac
arteries are patent.

Veins: An infrarenal IVC filter noted.  No portal venous gas.

Review of the MIP images confirms the above findings.

NON-VASCULAR

There is no intra-abdominal free air or free fluid.

Hepatobiliary: There is a 3 cm cyst in the left lobe of the liver.
The liver is otherwise unremarkable. No intrahepatic biliary ductal
dilatation. The gallbladder is unremarkable.

Pancreas: Unremarkable. No pancreatic ductal dilatation or
surrounding inflammatory changes.

Spleen: Normal in size without focal abnormality.

Adrenals/Urinary Tract: The adrenal glands are unremarkable. There
is a 5 cm left renal interpolar cyst. The kidneys are otherwise
unremarkable. There is no hydronephrosis on either side. The
visualized ureters and urinary bladder appear unremarkable.

Stomach/Bowel: There are scattered sigmoid diverticula without
active inflammatory changes. There is moderate stool throughout the
colon. No bowel obstruction or active inflammation. Normal appendix.

Lymphatic: No adenopathy.

Reproductive: The prostate and seminal vesicles are grossly
unremarkable.

Other: There is a broad-based supraumbilical ventral hernia
measuring 6 cm in diameter. There is protrusion of the anterior wall
of the distal stomach.

Musculoskeletal: No acute or significant osseous findings.

Review of the MIP images confirms the above findings.
IMPRESSION: 1. No acute intrathoracic, abdominal, or pelvic pathology. No aortic
aneurysm or dissection. No CT evidence of central pulmonary artery
embolus.
2. Cardiomegaly with multi vessel coronary vascular calcification
and postsurgical changes of CABG.
3. Coil embolization of the right internal iliac artery. Right
common iliac artery stent appears patent. Infrarenal IVC filter.
4. Sigmoid diverticulosis. No bowel obstruction or active
inflammation. Normal appendix.
